# Patient Record
Sex: Male | Born: 1950 | Race: Black or African American | Hispanic: No | Marital: Single | Smoking: Current every day smoker
Health system: Southern US, Community
[De-identification: ages and names within clinical notes are randomized; demographics above are authoritative.]

## PROBLEM LIST (undated history)

## (undated) DIAGNOSIS — C801 Malignant (primary) neoplasm, unspecified: Secondary | ICD-10-CM

## (undated) DIAGNOSIS — I10 Essential (primary) hypertension: Secondary | ICD-10-CM

## (undated) DIAGNOSIS — IMO0001 Reserved for inherently not codable concepts without codable children: Secondary | ICD-10-CM

## (undated) DIAGNOSIS — R569 Unspecified convulsions: Secondary | ICD-10-CM

## (undated) DIAGNOSIS — C139 Malignant neoplasm of hypopharynx, unspecified: Secondary | ICD-10-CM

## (undated) DIAGNOSIS — F1010 Alcohol abuse, uncomplicated: Secondary | ICD-10-CM

## (undated) DIAGNOSIS — J449 Chronic obstructive pulmonary disease, unspecified: Secondary | ICD-10-CM

## (undated) DIAGNOSIS — K219 Gastro-esophageal reflux disease without esophagitis: Secondary | ICD-10-CM

## (undated) DIAGNOSIS — J479 Bronchiectasis, uncomplicated: Secondary | ICD-10-CM

## (undated) HISTORY — DX: Essential (primary) hypertension: I10

## (undated) HISTORY — PX: OTHER SURGICAL HISTORY: SHX169

## (undated) HISTORY — PX: TRACHEOSTOMY: SUR1362

## (undated) HISTORY — DX: Unspecified convulsions: R56.9

---

## 2014-06-17 ENCOUNTER — Emergency Department (HOSPITAL_COMMUNITY)
Admission: EM | Admit: 2014-06-17 | Discharge: 2014-06-17 | Disposition: A | Discharge status: Home / Self Care | Payer: Medicaid Other | Attending: Emergency Medicine | Admitting: Emergency Medicine

## 2014-06-17 ENCOUNTER — Encounter (HOSPITAL_COMMUNITY): Payer: Self-pay | Admitting: Emergency Medicine

## 2014-06-17 DIAGNOSIS — R61 Generalized hyperhidrosis: Secondary | ICD-10-CM

## 2014-06-17 DIAGNOSIS — F17200 Nicotine dependence, unspecified, uncomplicated: Secondary | ICD-10-CM | POA: Insufficient documentation

## 2014-06-17 LAB — CBC WITH DIFFERENTIAL/PLATELET
Basophils Absolute: 0 10*3/uL (ref 0.0–0.1)
Basophils Relative: 0 % (ref 0–1)
Eosinophils Absolute: 0 10*3/uL (ref 0.0–0.7)
Eosinophils Relative: 0 % (ref 0–5)
HEMATOCRIT: 35.2 % — AB (ref 39.0–52.0)
HEMOGLOBIN: 12 g/dL — AB (ref 13.0–17.0)
LYMPHS PCT: 9 % — AB (ref 12–46)
Lymphs Abs: 0.7 10*3/uL (ref 0.7–4.0)
MCH: 30.2 pg (ref 26.0–34.0)
MCHC: 34.1 g/dL (ref 30.0–36.0)
MCV: 88.7 fL (ref 78.0–100.0)
MONO ABS: 0.7 10*3/uL (ref 0.1–1.0)
MONOS PCT: 8 % (ref 3–12)
Neutro Abs: 6.7 10*3/uL (ref 1.7–7.7)
Neutrophils Relative %: 83 % — ABNORMAL HIGH (ref 43–77)
Platelets: 263 10*3/uL (ref 150–400)
RBC: 3.97 MIL/uL — AB (ref 4.22–5.81)
RDW: 15.2 % (ref 11.5–15.5)
WBC: 8.1 10*3/uL (ref 4.0–10.5)

## 2014-06-17 LAB — BASIC METABOLIC PANEL
BUN: 13 mg/dL (ref 6–23)
CHLORIDE: 99 meq/L (ref 96–112)
CO2: 25 mEq/L (ref 19–32)
Calcium: 9.7 mg/dL (ref 8.4–10.5)
Creatinine, Ser: 0.9 mg/dL (ref 0.50–1.35)
GFR calc non Af Amer: >90 mL/min (ref >90)
Glucose, Bld: 137 mg/dL — ABNORMAL HIGH (ref 70–99)
Potassium: 4.9 mEq/L (ref 3.7–5.3)
SODIUM: 137 meq/L (ref 137–147)

## 2014-06-17 LAB — TROPONIN I: Troponin I: <0.3 ng/mL (ref <0.30)

## 2014-06-17 MED ORDER — ASPIRIN 81 MG PO CHEW
324.0000 mg | CHEWABLE_TABLET | Freq: Once | ORAL | Status: AC
  Administered 2014-06-17: 324 mg via ORAL

## 2014-06-17 MED ORDER — ASPIRIN 81 MG PO CHEW
CHEWABLE_TABLET | ORAL | Status: AC
  Filled 2014-06-17: qty 4

## 2014-06-17 NOTE — ED Provider Notes (Signed)
CSN: [id number]     Arrival date & time 06/17/14  1012 History  This chart was scribed for Randy Christen, MD by Marlowe Kays, ED Scribe. This patient was seen in room APA07/APA07 and the patient's care was started at 10:35 AM.  Chief Complaint  Patient presents with  . Fatigue   The history is provided by the patient and the EMS personnel. No language interpreter was used.   HPI Comments:  Randy Blackwell is a 63 y.o. male brought in by EMS, who presents to the Emergency Department complaining of sudden onset diaphoresis while he was walking earlier this morning. He reports associated shakiness.  Pt states his friend was concerned about him and called EMS, although he states he now feels back to normal. He denies SOB, CP, vomiting, or diarrhea. Pt denies any chronic illnesses. He states he does not have a PCP.   History reviewed. No pertinent past medical history. History reviewed. No pertinent past surgical history. History reviewed. No pertinent family history. History  Substance Use Topics  . Smoking status: Current Every Day Smoker -- 0.50 packs/day    Types: Cigarettes  . Smokeless tobacco: Not on file  . Alcohol Use: Yes     Comment: 1-2 beers a day    Review of Systems A complete 10 system review of systems was obtained and all systems are negative except as noted in the HPI and PMH.   Allergies  Review of patient's allergies indicates no known allergies.  Home Medications   Prior to Admission medications   Not on File   Triage Vitals: BP 119/88  Pulse 100  Temp(Src) 98.5 F (36.9 C) (Oral)  Resp 20  Ht 5\' 10"  (1.778 m)  Wt 130 lb (58.968 kg)  BMI 18.65 kg/m2  SpO2 100% Physical Exam  Nursing note and vitals reviewed. Constitutional: He is oriented to person, place, and time. He appears well-developed and well-nourished. No distress.  HENT:  Head: Normocephalic and atraumatic.  Eyes: Conjunctivae and EOM are normal. Pupils are equal, round, and reactive to  light.  Neck: Normal range of motion. Neck supple.  Cardiovascular: Normal rate, regular rhythm and normal heart sounds.   Pulmonary/Chest: Effort normal and breath sounds normal.  Abdominal: Soft. Bowel sounds are normal.  Musculoskeletal: Normal range of motion.  Neurological: He is alert and oriented to person, place, and time.  Skin: Skin is warm and dry. He is not diaphoretic.  Psychiatric: He has a normal mood and affect. His behavior is normal.    ED Course  Procedures (including critical care time) DIAGNOSTIC STUDIES: Oxygen Saturation is 100% on RA, normal by my interpretation.   COORDINATION OF CARE: 10:37 AM- Will order lab work. Pt verbalizes understanding and agrees to plan.  Medications  aspirin chewable tablet 324 mg (324 mg Oral Given 06/17/14 1137)    Labs Review Labs Reviewed  BASIC METABOLIC PANEL - Abnormal; Notable for the following:    Glucose, Bld 137 (*)    All other components within normal limits  CBC WITH DIFFERENTIAL - Abnormal; Notable for the following:    RBC 3.97 (*)    Hemoglobin 12.0 (*)    HCT 35.2 (*)    Neutrophils Relative % 83 (*)    Lymphocytes Relative 9 (*)    All other components within normal limits  TROPONIN I    Imaging Review No results found.   EKG Interpretation   Date/Time:  Sunday June 17 2014 10:47:20 EDT Ventricular Rate:  67 PR  Interval:  155 QRS Duration: 79 QT Interval:  411 QTC Calculation: 434 R Axis:   62 Text Interpretation:  Sinus rhythm Left ventricular hypertrophy Inferior  infarct, acute (LCx) Anterior infarct, old Lateral leads are also involved  Confirmed by COOK  MD, BRIAN ([id number]) on 06/17/2014 12:16:15 PM      MDM   Final diagnoses:  Diaphoresis    Patient is totally back to his baseline. No chest pain, dyspnea, diaphoresis, nausea. Glucose minimally elevated. EKG, troponin negative. Patient encouraged to increase fluids and stay out of the sun.  I personally performed the services  described in this documentation, which was scribed in my presence. The recorded information has been reviewed and is accurate.    Randy Christen, MD 06/17/14 1249

## 2014-06-17 NOTE — Discharge Instructions (Signed)
Increase fluids. Rest. Return if worse, especially chest pain or shortness of breath

## 2014-06-17 NOTE — ED Notes (Addendum)
Pt brought in EMS, per EMS pt was walking this am like he usually does in the morning and became diaphoretic. Pt denies any cp,sob,dizziness. Pt family wanted pt evaluated. nad noted.CBG en route 227. Pt non-diaphoretic at this time.

## 2016-04-06 ENCOUNTER — Encounter: Payer: Self-pay | Admitting: Internal Medicine

## 2016-04-07 ENCOUNTER — Other Ambulatory Visit (HOSPITAL_COMMUNITY): Payer: Self-pay | Admitting: Internal Medicine

## 2016-04-07 DIAGNOSIS — R634 Abnormal weight loss: Secondary | ICD-10-CM

## 2016-04-10 ENCOUNTER — Ambulatory Visit (HOSPITAL_COMMUNITY)
Admission: RE | Admit: 2016-04-10 | Discharge: 2016-04-10 | Disposition: A | Discharge status: Home / Self Care | Payer: Medicaid Other | Source: Ambulatory Visit | Attending: Internal Medicine | Admitting: Internal Medicine

## 2016-04-10 DIAGNOSIS — R634 Abnormal weight loss: Secondary | ICD-10-CM | POA: Insufficient documentation

## 2016-04-10 DIAGNOSIS — J479 Bronchiectasis, uncomplicated: Secondary | ICD-10-CM | POA: Insufficient documentation

## 2016-04-10 DIAGNOSIS — J439 Emphysema, unspecified: Secondary | ICD-10-CM | POA: Diagnosis not present

## 2016-04-10 DIAGNOSIS — I2510 Atherosclerotic heart disease of native coronary artery without angina pectoris: Secondary | ICD-10-CM | POA: Insufficient documentation

## 2016-04-10 LAB — POCT I-STAT CREATININE: CREATININE: 0.8 mg/dL (ref 0.61–1.24)

## 2016-04-10 MED ORDER — IOPAMIDOL (ISOVUE-300) INJECTION 61%
75.0000 mL | Freq: Once | INTRAVENOUS | Status: AC | PRN
  Administered 2016-04-10: 75 mL via INTRAVENOUS

## 2016-04-23 ENCOUNTER — Emergency Department (HOSPITAL_COMMUNITY): Payer: Medicaid Other

## 2016-04-23 ENCOUNTER — Emergency Department (HOSPITAL_COMMUNITY)
Admission: EM | Admit: 2016-04-23 | Discharge: 2016-04-23 | Disposition: A | Discharge status: Home / Self Care | Payer: Medicaid Other | Attending: Emergency Medicine | Admitting: Emergency Medicine

## 2016-04-23 ENCOUNTER — Encounter (HOSPITAL_COMMUNITY): Payer: Self-pay | Admitting: Emergency Medicine

## 2016-04-23 DIAGNOSIS — F17210 Nicotine dependence, cigarettes, uncomplicated: Secondary | ICD-10-CM | POA: Insufficient documentation

## 2016-04-23 DIAGNOSIS — Z09 Encounter for follow-up examination after completed treatment for conditions other than malignant neoplasm: Secondary | ICD-10-CM | POA: Diagnosis present

## 2016-04-23 DIAGNOSIS — J471 Bronchiectasis with (acute) exacerbation: Secondary | ICD-10-CM | POA: Diagnosis not present

## 2016-04-23 LAB — URINALYSIS, ROUTINE W REFLEX MICROSCOPIC
GLUCOSE, UA: NEGATIVE mg/dL
Hgb urine dipstick: NEGATIVE
Nitrite: NEGATIVE
PH: 6 (ref 5.0–8.0)
Protein, ur: 30 mg/dL — AB
SPECIFIC GRAVITY, URINE: 1.025 (ref 1.005–1.030)

## 2016-04-23 LAB — CBC WITH DIFFERENTIAL/PLATELET
BASOS ABS: 0 10*3/uL (ref 0.0–0.1)
BASOS PCT: 0 %
EOS PCT: 1 %
Eosinophils Absolute: 0.1 10*3/uL (ref 0.0–0.7)
HEMATOCRIT: 39.5 % (ref 39.0–52.0)
Hemoglobin: 13.5 g/dL (ref 13.0–17.0)
Lymphocytes Relative: 16 %
Lymphs Abs: 0.9 10*3/uL (ref 0.7–4.0)
MCH: 32.6 pg (ref 26.0–34.0)
MCHC: 34.2 g/dL (ref 30.0–36.0)
MCV: 95.4 fL (ref 78.0–100.0)
Monocytes Absolute: 0.8 10*3/uL (ref 0.1–1.0)
Monocytes Relative: 14 %
NEUTROS ABS: 3.7 10*3/uL (ref 1.7–7.7)
Neutrophils Relative %: 69 %
PLATELETS: 315 10*3/uL (ref 150–400)
RBC: 4.14 MIL/uL — ABNORMAL LOW (ref 4.22–5.81)
RDW: 14.3 % (ref 11.5–15.5)
WBC: 5.5 10*3/uL (ref 4.0–10.5)

## 2016-04-23 LAB — TSH: TSH: 0.266 u[IU]/mL — ABNORMAL LOW (ref 0.350–4.500)

## 2016-04-23 LAB — COMPREHENSIVE METABOLIC PANEL
ALBUMIN: 3 g/dL — AB (ref 3.5–5.0)
ALT: 7 U/L — ABNORMAL LOW (ref 17–63)
AST: 14 U/L — AB (ref 15–41)
Alkaline Phosphatase: 58 U/L (ref 38–126)
Anion gap: 8 (ref 5–15)
BUN: 6 mg/dL (ref 6–20)
CO2: 30 mmol/L (ref 22–32)
Calcium: 9.3 mg/dL (ref 8.9–10.3)
Chloride: 103 mmol/L (ref 101–111)
Creatinine, Ser: 0.7 mg/dL (ref 0.61–1.24)
GFR calc Af Amer: >60 mL/min (ref >60)
GFR calc non Af Amer: >60 mL/min (ref >60)
GLUCOSE: 92 mg/dL (ref 65–99)
POTASSIUM: 3.8 mmol/L (ref 3.5–5.1)
Sodium: 141 mmol/L (ref 135–145)
Total Bilirubin: 0.4 mg/dL (ref 0.3–1.2)
Total Protein: 6.6 g/dL (ref 6.5–8.1)

## 2016-04-23 LAB — URINE MICROSCOPIC-ADD ON

## 2016-04-23 MED ORDER — SODIUM CHLORIDE 0.9 % IV BOLUS (SEPSIS)
1000.0000 mL | Freq: Once | INTRAVENOUS | Status: AC
  Administered 2016-04-23: 1000 mL via INTRAVENOUS

## 2016-04-23 MED ORDER — PANTOPRAZOLE SODIUM 40 MG IV SOLR
20.0000 mg | Freq: Once | INTRAVENOUS | Status: AC
Start: 2016-04-23 — End: 2016-04-23
  Administered 2016-04-23: 20 mg via INTRAVENOUS
  Filled 2016-04-23: qty 40

## 2016-04-23 MED ORDER — LEVOFLOXACIN 500 MG PO TABS: 500.0000 mg | ORAL_TABLET | Freq: Every day | ORAL | Status: DC

## 2016-04-23 MED ORDER — PREDNISONE 50 MG PO TABS
ORAL_TABLET | ORAL | Status: DC
Start: 2016-04-23 — End: 2016-05-20

## 2016-04-23 NOTE — ED Notes (Signed)
RN called to room. Pt coughed up small amount of bight red blood. EDP notified.

## 2016-04-23 NOTE — ED Notes (Signed)
RN spoke with pt friend Langley Adie [phone]. States that she received a call this am from [facility] stating pt should be seen at [facility] ED for dehydration. Ms. Carmie End also repots pt has had poor appetite for several months and has been vomitting blood, reports pt seen at [facility] ED last night for same.

## 2016-04-23 NOTE — ED Notes (Signed)
Medical records requested from [facility].

## 2016-04-23 NOTE — ED Provider Notes (Signed)
CSN: [id number]     Arrival date & time 04/23/16  1007 History  By signing my name below, I, Stephania Fragmin, attest that this documentation has been prepared under the direction and in the presence of [name], MD. Electronically Signed: Stephania Fragmin, ED Scribe. 04/23/2016. 11:22 AM.    Chief Complaint  Patient presents with  . Follow-up   The history is provided by the patient. No language interpreter was used.   HPI Comments: Randy Blackwell is a 65 y.o. male who presents to the Emergency Department for a follow-up after being seen at [facility] ED last night at 3 AM, about 8 hours ago, for hemoptysis, with onset 2 weeks ago. Patient also notes a sore throat and reports he lost 20 pounds over the past 6 months. Patient had an XR and blood tests, but states, "they found nothing." He was sent home but this morning was called by his power of attorney, Ms. Monna Fam [phone]), and was instructed to come to the ED to check his "nose and throat." Patient himself is unsure why is here. He states he feels the same as he did 2 hours ago. He reports a history of smoking. He denies fever, chills, or chest pain.   History reviewed. No pertinent past medical history. History reviewed. No pertinent past surgical history. History reviewed. No pertinent family history. Social History  Substance Use Topics  . Smoking status: Current Every Day Smoker -- 0.50 packs/day    Types: Cigarettes  . Smokeless tobacco: None  . Alcohol Use: Yes     Comment: "a lot"    Review of Systems  Constitutional: Positive for unexpected weight change. Negative for fever and chills.  HENT: Positive for sore throat.   Cardiovascular: Negative for chest pain.  Gastrointestinal: Positive for vomiting.  All other systems reviewed and are negative.   Allergies  Review of patient's allergies indicates no known allergies.  Home Medications   Prior to Admission medications   Medication Sig Start Date End Date Taking?  Authorizing Provider  levofloxacin (LEVAQUIN) 500 MG tablet Take 1 tablet (500 mg total) by mouth daily. 04/23/16   [name], MD  predniSONE (DELTASONE) 50 MG tablet One tab or 6 days, one half tablet for 6 days 04/23/16   [name], MD   BP 158/86 mmHg  Pulse 65  Temp(Src) 97.7 F (36.5 C) (Oral)  Resp 18  Ht 5\' 10"  (1.778 m)  Wt 130 lb (58.968 kg)  BMI 18.65 kg/m2  SpO2 99% Physical Exam  Constitutional: He is oriented to person, place, and time. He appears well-developed and well-nourished.  Thin, pleasant, no acute distress.  HENT:  Head: Normocephalic and atraumatic.  Throat appears normal.  Eyes: Conjunctivae and EOM are normal. Pupils are equal, round, and reactive to light.  Neck: Normal range of motion. Neck supple.  Cardiovascular: Normal rate and regular rhythm.   Pulmonary/Chest: Effort normal and breath sounds normal.  Abdominal: Soft. Bowel sounds are normal.  Musculoskeletal: Normal range of motion.  Neurological: He is alert and oriented to person, place, and time.  Skin: Skin is warm and dry.  Psychiatric: He has a normal mood and affect. His behavior is normal.  Nursing note and vitals reviewed.   ED Course  Procedures (including critical care time)  DIAGNOSTIC STUDIES: Oxygen Saturation is 100% on RA, normal by my interpretation.    COORDINATION OF CARE: 11:09 AM - Will discuss pt with PoA.  Labs Review Labs Reviewed  CBC WITH DIFFERENTIAL/PLATELET -  Abnormal; Notable for the following:    RBC 4.14 (*)    All other components within normal limits  COMPREHENSIVE METABOLIC PANEL - Abnormal; Notable for the following:    Albumin 3.0 (*)    AST 14 (*)    ALT 7 (*)    All other components within normal limits  URINALYSIS, ROUTINE W REFLEX MICROSCOPIC (NOT AT [facility]) - Abnormal; Notable for the following:    APPearance HAZY (*)    Bilirubin Urine SMALL (*)    Ketones, ur TRACE (*)    Protein, ur 30 (*)    Leukocytes, UA MODERATE (*)    All other  components within normal limits  TSH - Abnormal; Notable for the following:    TSH 0.266 (*)    All other components within normal limits  URINE MICROSCOPIC-ADD ON - Abnormal; Notable for the following:    Squamous Epithelial / LPF 6-30 (*)    Bacteria, UA FEW (*)    All other components within normal limits    Imaging Review Dg Chest 2 View  04/23/2016  CLINICAL DATA:  Pt's brother states they were seen at [facility] ER last night and had tests ran for several months of coughing up blood and throat pain. Said someone called this morning and said to go to the emergency room but does not know why. EXAM: CHEST  2 VIEW COMPARISON:  Chest CT, 04/10/2016 FINDINGS: Normal heart, mediastinum and hila. Lungs show mild increased markings a right base reflecting the cystic bronchiectasis noted on prior CT. No evidence of pneumonia or pulmonary edema. No pleural effusion or pneumothorax. Skeletal structures are unremarkable. IMPRESSION: No acute cardiopulmonary disease. Electronically Signed   By: Lajean Manes M.D.   On: 04/23/2016 12:21   I have personally reviewed and evaluated these images and lab results as part of my medical decision-making.   EKG Interpretation None      MDM   Final diagnoses:  Bronchiectasis with acute exacerbation ([place])   Patient is in no acute distress, respiratory or otherwise. CT chest from 04/10/2016 reviewed which showed right lower lobe bronchiectasis. Will Rx Levaquin and prednisone. Will get primary care follow-up.   I personally performed the services described in this documentation, which was scribed in my presence. The recorded information has been reviewed and is accurate.      [name], MD 04/23/16 [phone]

## 2016-04-23 NOTE — Discharge Instructions (Signed)
Bronchiectasis Bronchiectasis is a condition in which the airways (bronchi) are damaged and widened. This makes it difficult for the lungs to get rid of mucus. As a result, mucus gathers in the airways, and this often leads to lung infections. Infection can cause inflammation in the airways, which may further weaken and damage the bronchi.  CAUSES  Bronchiectasis may be present at birth (congenital) or may develop later in life. Sometimes there is no apparent cause. Some common causes include:  Cystic fibrosis.   Recurrent lung infections (such as pneumonia, tuberculosis, or fungal infections).  Foreign bodies or other blockages in the lungs.  Breathing in fluid, food, or other foreign objects (aspiration). SIGNS AND SYMPTOMS  Common symptoms include:  A daily cough that brings up mucus and lasts for more than 3 weeks.  Frequent lung infections (such as pneumonia, tuberculosis, or fungal infections).  Shortness of breath and wheezing.   Weakness and fatigue. DIAGNOSIS  Various tests may be done to help diagnose bronchiectasis. Tests may include:  Chest X-rays or CT scans.   Breathing tests to help determine how your lungs are working.   Sputum cultures to check for infection.   Blood tests and other tests to check for related diseases or causes, such as cystic fibrosis. TREATMENT  Treatment varies depending on the severity of the condition. Medicines may be given to loosen the mucus to be coughed up (expectorants), to relax the muscles of the air passages (bronchodilators), or to prevent or treat infections (antibiotics). Physical therapy methods may be recommended to help clear mucus from the lungs. For severe cases, surgery may be done to remove the affected part of the lung. HOME CARE INSTRUCTIONS   Get plenty of rest.   Only take over-the-counter or prescription medicines as directed by your health care provider. If antibiotic medicines were prescribed, take them as  directed. Finish them even if you start to feel better.  Avoid sedatives and antihistamines unless otherwise directed by your health care provider. These medicines tend to thicken the mucus in the lungs.   Perform any breathing exercises or techniques to clear the lungs as directed by your health care provider.  Drink enough fluids to keep your urine clear or pale yellow.  Consider using a cold steam vaporizer or humidifier in your room or home to help loosen secretions.   If the cough is worse at night, try sleeping in a semi-upright position in a recliner or using a couple of pillows.   Avoid cigarette smoke and lung irritants. If you smoke, quit.  Stay inside when pollution and ozone levels are high.   Stay current with vaccinations and immunizations.   Follow up with your health care provider as directed.  SEEK MEDICAL CARE IF:  You cough up more thick, discolored mucus (sputum) that is yellow to green in color.  You have a fever or persistent symptoms for more than 2-3 days.  You cannot control your cough and are losing sleep. SEEK IMMEDIATE MEDICAL CARE IF:   You cough up blood.   You have chest pain or increasing shortness of breath.   You have pain that is getting worse or is uncontrolled with medicines.   You have a fever and your symptoms suddenly get worse. MAKE SURE YOU:  Understand these instructions.   Will watch your condition.   Will get help right away if you are not doing well or get worse.    This information is not intended to replace advice given to   you by your health care provider. Make sure you discuss any questions you have with your health care provider.   Document Released: 10/11/2007 Document Revised: 12/19/2013 Document Reviewed: 06/21/2013 Elsevier Interactive Patient Education 2016 [employer].   Prescription for antibiotic and prednisone. Stop smoking. Follow-up your primary care doctor.

## 2016-04-23 NOTE — ED Notes (Signed)
Per staff at [facility] friend Langley Adie called to [facility] with report of pt sx and being seen at [facility] ED last night. Staff instructed that pt may go to [facility] or [facility] if pt of friend felt he needed further evaluation.

## 2016-04-23 NOTE — ED Notes (Signed)
Pt's brother states they were seen at [facility] ER last night and had tests ran for several months of coughing up blood and throat pain.  Said someone called this morning and said to go to the emergency room but does not know why.

## 2016-04-30 ENCOUNTER — Encounter: Payer: Self-pay | Admitting: Gastroenterology

## 2016-04-30 ENCOUNTER — Telehealth: Payer: Self-pay | Admitting: Gastroenterology

## 2016-04-30 ENCOUNTER — Other Ambulatory Visit: Payer: Self-pay

## 2016-04-30 ENCOUNTER — Ambulatory Visit (INDEPENDENT_AMBULATORY_CARE_PROVIDER_SITE_OTHER): Payer: Medicaid Other | Admitting: Gastroenterology

## 2016-04-30 VITALS — BP 173/94 | HR 84 | Temp 98.9°F | Ht 71.0 in | Wt 111.4 lb

## 2016-04-30 DIAGNOSIS — R634 Abnormal weight loss: Secondary | ICD-10-CM

## 2016-04-30 DIAGNOSIS — R1310 Dysphagia, unspecified: Secondary | ICD-10-CM | POA: Insufficient documentation

## 2016-04-30 MED ORDER — NA SULFATE-K SULFATE-MG SULF 17.5-3.13-1.6 GM/177ML PO SOLN: 1.0000 | Freq: Once | ORAL | Status: DC

## 2016-04-30 NOTE — Patient Instructions (Signed)
Randy Blackwell  04/30/2016     @PREFPERIOPPHARMACY @   Your procedure is scheduled on 05/05/2016.  Report to [facility] at 6:15 A.M.  Call this number if you have problems the morning of surgery:  [phone]   Remember:  Do not eat food or drink liquids after midnight.  Take these medicines the morning of surgery with A SIP OF WATER NA   Do not wear jewelry, make-up or nail polish.  Do not wear lotions, powders, or perfumes.  You may wear deodorant.  Do not shave 48 hours prior to surgery.  Men may shave face and neck.  Do not bring valuables to the hospital.  [facility] is not responsible for any belongings or valuables.  Contacts, dentures or bridgework may not be worn into surgery.  Leave your suitcase in the car.  After surgery it may be brought to your room.  For patients admitted to the hospital, discharge time will be determined by your treatment team.  Patients discharged the day of surgery will not be allowed to drive home.    Please read over the following fact sheets that you were given. Anesthesia Post-op Instructions     PATIENT INSTRUCTIONS POST-ANESTHESIA  IMMEDIATELY FOLLOWING SURGERY:  Do not drive or operate machinery for the first twenty four hours after surgery.  Do not make any important decisions for twenty four hours after surgery or while taking narcotic pain medications or sedatives.  If you develop intractable nausea and vomiting or a severe headache please notify your doctor immediately.  FOLLOW-UP:  Please make an appointment with your surgeon as instructed. You do not need to follow up with anesthesia unless specifically instructed to do so.  WOUND CARE INSTRUCTIONS (if applicable):  Keep a dry clean dressing on the anesthesia/puncture wound site if there is drainage.  Once the wound has quit draining you may leave it open to air.  Generally you should leave the bandage intact for twenty four hours unless there is drainage.  If the epidural site  drains for more than 36-48 hours please call the anesthesia department.  QUESTIONS?:  Please feel free to call your physician or the hospital operator if you have any questions, and they will be happy to assist you.      Colonoscopy A colonoscopy is an exam to look at the entire large intestine (colon). This exam can help find problems such as tumors, polyps, inflammation, and areas of bleeding. The exam takes about 1 hour.  LET [facility] CARE PROVIDER KNOW ABOUT:   Any allergies you have.  All medicines you are taking, including vitamins, herbs, eye drops, creams, and over-the-counter medicines.  Previous problems you or members of your family have had with the use of anesthetics.  Any blood disorders you have.  Previous surgeries you have had.  Medical conditions you have. RISKS AND COMPLICATIONS  Generally, this is a safe procedure. However, as with any procedure, complications can occur. Possible complications include:  Bleeding.  Tearing or rupture of the colon wall.  Reaction to medicines given during the exam.  Infection (rare). BEFORE THE PROCEDURE   Ask your health care provider about changing or stopping your regular medicines.  You may be prescribed an oral bowel prep. This involves drinking a large amount of medicated liquid, starting the day before your procedure. The liquid will cause you to have multiple loose stools until your stool is almost clear or light green. This cleans out your colon in preparation for the  procedure.  Do not eat or drink anything else once you have started the bowel prep, unless your health care provider tells you it is safe to do so.  Arrange for someone to drive you home after the procedure. PROCEDURE   You will be given medicine to help you relax (sedative).  You will lie on your side with your knees bent.  A long, flexible tube with a light and camera on the end (colonoscope) will be inserted through the rectum and into the  colon. The camera sends video back to a computer screen as it moves through the colon. The colonoscope also releases carbon dioxide gas to inflate the colon. This helps your health care provider see the area better.  During the exam, your health care provider may take a small tissue sample (biopsy) to be examined under a microscope if any abnormalities are found.  The exam is finished when the entire colon has been viewed. AFTER THE PROCEDURE   Do not drive for 24 hours after the exam.  You may have a small amount of blood in your stool.  You may pass moderate amounts of gas and have mild abdominal cramping or bloating. This is caused by the gas used to inflate your colon during the exam.  Ask when your test results will be ready and how you will get your results. Make sure you get your test results.   This information is not intended to replace advice given to you by your health care provider. Make sure you discuss any questions you have with your health care provider.   Document Released: 12/11/2000 Document Revised: 10/04/2013 Document Reviewed: 08/21/2013 Elsevier Interactive Patient Education 2016 [place]. Esophagogastroduodenoscopy Esophagogastroduodenoscopy (EGD) is a procedure that is used to examine the lining of the esophagus, stomach, and first part of the small intestine (duodenum). A long, flexible, lighted tube with a camera attached (endoscope) is inserted down the throat to view these organs. This procedure is done to detect problems or abnormalities, such as inflammation, bleeding, ulcers, or growths, in order to treat them. The procedure lasts 5-20 minutes. It is usually an outpatient procedure, but it may need to be performed in a hospital in emergency cases. LET [facility] CARE PROVIDER KNOW ABOUT:  Any allergies you have.  All medicines you are taking, including vitamins, herbs, eye drops, creams, and over-the-counter medicines.  Previous problems you or members  of your family have had with the use of anesthetics.  Any blood disorders you have.  Previous surgeries you have had.  Medical conditions you have. RISKS AND COMPLICATIONS Generally, this is a safe procedure. However, problems can occur and include:  Infection.  Bleeding.  Tearing (perforation) of the esophagus, stomach, or duodenum.  Difficulty breathing or not being able to breathe.  Excessive sweating.  Spasms of the larynx.  Slowed heartbeat.  Low blood pressure. BEFORE THE PROCEDURE  Do not eat or drink anything after midnight on the night before the procedure or as directed by your health care provider.  Do not take your regular medicines before the procedure if your health care provider asks you not to. Ask your health care provider about changing or stopping those medicines.  If you wear dentures, be prepared to remove them before the procedure.  Arrange for someone to drive you home after the procedure. PROCEDURE  A numbing medicine (local anesthetic) may be sprayed in your throat for comfort and to stop you from gagging or coughing.  You will have an  IV tube inserted in a vein in your hand or arm. You will receive medicines and fluids through this tube.  You will be given a medicine to relax you (sedative).  A pain reliever will be given through the IV tube.  A mouth guard may be placed in your mouth to protect your teeth and to keep you from biting on the endoscope.  You will be asked to lie on your left side.  The endoscope will be inserted down your throat and into your esophagus, stomach, and duodenum.  Air will be put through the endoscope to allow your health care provider to clearly view the lining of your esophagus.  The lining of your esophagus, stomach, and duodenum will be examined. During the exam, your health care provider may:  Remove tissue to be examined under a microscope (biopsy) for inflammation, infection, or other medical  problems.  Remove growths.  Remove objects (foreign bodies) that are stuck.  Treat any bleeding with medicines or other devices that stop tissues from bleeding (hot cautery, clipping devices).  Widen (dilate) or stretch narrowed areas of your esophagus and stomach.  The endoscope will be withdrawn. AFTER THE PROCEDURE  You will be taken to a recovery area for observation. Your blood pressure, heart rate, breathing rate, and blood oxygen level will be monitored often until the medicines you were given have worn off.  Do not eat or drink anything until the numbing medicine has worn off and your gag reflex has returned. You may choke.  Your health care provider should be able to discuss his or her findings with you. It will take longer to discuss the test results if any biopsies were taken.   This information is not intended to replace advice given to you by your health care provider. Make sure you discuss any questions you have with your health care provider.   Document Released: 04/16/2005 Document Revised: 01/04/2015 Document Reviewed: 11/16/2012 Elsevier Interactive Patient Education 2016 [employer] Esophageal Dilatation Esophageal dilatation is a procedure to open a blocked or narrowed part of the esophagus. The esophagus is the long tube in your throat that carries food and liquid from your mouth to your stomach. The procedure is also called esophageal dilation.  You may need this procedure if you have a buildup of scar tissue in your esophagus that makes it difficult, painful, or even impossible to swallow. This can be caused by gastroesophageal reflux disease (GERD). In rare cases, people need this procedure because they have cancer of the esophagus or a problem with the way food moves through the esophagus. Sometimes you may need to have another dilatation to enlarge the opening of the esophagus gradually. LET [facility] CARE PROVIDER KNOW ABOUT:   Any allergies you  have.  All medicines you are taking, including vitamins, herbs, eye drops, creams, and over-the-counter medicines.  Previous problems you or members of your family have had with the use of anesthetics.  Any blood disorders you have.  Previous surgeries you have had.  Medical conditions you have.  Any antibiotic medicines you are required to take before dental procedures. RISKS AND COMPLICATIONS Generally, this is a safe procedure. However, problems can occur and include:  Bleeding from a tear in the lining of the esophagus.  A hole (perforation) in the esophagus. BEFORE THE PROCEDURE  Do not eat or drink anything after midnight on the night before the procedure or as directed by your health care provider.  Ask your health care provider about  changing or stopping your regular medicines. This is especially important if you are taking diabetes medicines or blood thinners.  Plan to have someone take you home after the procedure. PROCEDURE   You will be given a medicine that makes you relaxed and sleepy (sedative).  A medicine may be sprayed or gargled to numb the back of the throat.  Your health care provider can use various instruments to do an esophageal dilatation. During the procedure, the instrument used will be placed in your mouth and passed down into your esophagus. Options include:  Simple dilators. This instrument is carefully placed in the esophagus to stretch it.  Guided wire bougies. In this method, a flexible tube (endoscope) is used to insert a wire into the esophagus. The dilator is passed over this wire to enlarge the esophagus. Then the wire is removed.  Balloon dilators. An endoscope with a small balloon at the end is passed down into the esophagus. Inflating the balloon gently stretches the esophagus and opens it up. AFTER THE PROCEDURE  Your blood pressure, heart rate, breathing rate, and blood oxygen level will be monitored often until the medicines you were  given have worn off.  Your throat may feel slightly sore and will probably still feel numb. This will improve slowly over time.  You will not be allowed to eat or drink until the throat numbness has resolved.  If this is a same-day procedure, you may be allowed to go home once you have been able to drink, urinate, and sit on the edge of the bed without nausea or dizziness.  If this is a same-day procedure, you should have a friend or family member with you for the next 24 hours after the procedure.   This information is not intended to replace advice given to you by your health care provider. Make sure you discuss any questions you have with your health care provider.   Document Released: 02/04/2006 Document Revised: 01/04/2015 Document Reviewed: 04/25/2014 Elsevier Interactive Patient Education [employer].

## 2016-04-30 NOTE — Patient Instructions (Signed)
We have scheduled you for a colonoscopy and upper endoscopy with dilation with Dr. Oneida Alar as soon as possible.  Further recommendations to follow.

## 2016-04-30 NOTE — Progress Notes (Signed)
Primary Care Physician:  [name], MD Primary Gastroenterologist:  Dr. Oneida Alar   Chief Complaint  Patient presents with  . Dysphagia    HPI:   Randy Blackwell is a 65 y.o. male presenting today at the request of his PCP secondary to weight loss and dysphagia.   Notes progressive dysphagia over the past few weeks but present "for a long time", points to neck and upper chest. No odynophagia. Associated nausea. No vomiting. Occasional reflux symptoms. No prior endoscopy. No prior colonoscopy. Normal baseline around 140-150. Now 111. Weight loss unintentional over the past year. No abdominal pain. Has been eating broth. Hemoptysis noted. No hematemesis. No rectal bleeding. No FH of colon cancer, stomach cancers. Occasional alka seltzer. CT chest with contrast April 10, 2016, noted extensive bronchiestasis in the right lower lobe basal segments, presumably from necrotizing pneumonia. Most recent CXR from 4/27.  Prescribed Levaquin. Esophagus unremarkable in appearance.    Past Medical History  Diagnosis Date  . Seizures ([place])     per PCP notes  . Hypertension     per PCP notes  . Diabetes ([place])     per PCP notes     Past Surgical History  Procedure Laterality Date  . None      Current Outpatient Prescriptions  Medication Sig Dispense Refill  . aspirin-sod bicarb-citric acid (ALKA-SELTZER) 325 MG TBEF tablet Take 325 mg by mouth every 6 (six) hours as needed.    [name] levofloxacin (LEVAQUIN) 500 MG tablet Take 1 tablet (500 mg total) by mouth daily. 10 tablet 0  . predniSONE (DELTASONE) 50 MG tablet One tab or 6 days, one half tablet for 6 days 9 tablet 0   No current facility-administered medications for this visit.    Allergies as of 04/30/2016  . (No Known Allergies)    Family History  Problem Relation Age of Onset  . Colon cancer Neg Hx   . Pancreatic cancer Neg Hx   . Stomach cancer Neg Hx     Social History   Social History  . Marital Status: Single   Spouse Name: N/A  . Number of Children: N/A  . Years of Education: N/A   Occupational History  . [employer]    Social History Main Topics  . Smoking status: Current Every Day Smoker -- 1.00 packs/day    Types: Cigarettes  . Smokeless tobacco: Not on file  . Alcohol Use: 0.0 oz/week    0 Standard drinks or equivalent per week     Comment: wine and beer. Drinks about a 6 pack of beer a day. About a 1/5 or more of wine a day.   . Drug Use: Yes     Comment: marijuana   . Sexual Activity: Not on file   Other Topics Concern  . Not on file   Social History Narrative    Review of Systems: Gen: see HPI  CV: Denies chest pain, heart palpitations, peripheral edema, syncope.  Resp: see HPI  GI: see HPI  GU : Denies urinary burning, urinary frequency, urinary hesitancy MS: Denies joint pain, muscle weakness, cramps, or limitation of movement.  Derm: Denies rash, itching, dry skin Psych: Denies depression, anxiety, memory loss, and confusion Heme: see HPI   Physical Exam: BP 173/94 mmHg  Pulse 84  Temp(Src) 98.9 F (37.2 C) (Oral)  Ht 5\' 11"  (1.803 m)  Wt 111 lb 6.4 oz (50.531 kg)  BMI 15.54 kg/m2 General:   Alert and oriented. Pleasant and cooperative. Cachectic-appearing  Head:  Normocephalic and atraumatic. Eyes:  Without icterus, sclera clear and conjunctiva pink.  Ears:  Normal auditory acuity. Nose:  No deformity, discharge,  or lesions. Mouth:  No deformity or lesions, oral mucosa pink.  Lungs:  Scattered rhonchi, coarse  Heart:  S1, S2 present without murmurs appreciated.  Abdomen:  +BS, soft, non-tender and non-distended. No HSM noted. No guarding or rebound. No masses appreciated.  Rectal:  Deferred  Msk:  Symmetrical without gross deformities. Normal posture. Extremities:  Without edema. Neurologic:  Alert and  oriented x4;  grossly normal neurologically. Psych:  Alert and cooperative. Normal mood and affect.  Lab Results  Component Value Date   WBC 5.5 04/23/2016    HGB 13.5 04/23/2016   HCT 39.5 04/23/2016   MCV 95.4 04/23/2016   PLT 315 04/23/2016   Lab Results  Component Value Date   ALT 7* 04/23/2016   AST 14* 04/23/2016   ALKPHOS 58 04/23/2016   BILITOT 0.4 04/23/2016   Lab Results  Component Value Date   TSH 0.266* 04/23/2016   Lab Results  Component Value Date   CREATININE 0.70 04/23/2016   BUN 6 04/23/2016   NA 141 04/23/2016   K 3.8 04/23/2016   CL 103 04/23/2016   CO2 30 04/23/2016

## 2016-04-30 NOTE — Assessment & Plan Note (Signed)
Unclear etiology. TCS/EGD as planned. TSH low, will copy to PCP (drawn in ED). May need abdominal imaging if TCS/EGD negative.

## 2016-04-30 NOTE — Assessment & Plan Note (Signed)
65 year old male with what appears to be progressive dysphagia that has worsened over the past 2-3 weeks. Associated weight loss over the past year of at least 40 lbs. No hematemesis noted. Esophagus normal on CT. No prior EGD or colonoscopy. No FH of colon cancer. Weight loss is quite concerning, and GI etiology needs to be ruled out. As of note, TSH low recently.   Proceed with colonoscopy/EGD/dilation with Dr. Oneida Alar in the near future. The risks, benefits, and alternatives have been discussed in detail with the patient. They state understanding and desire to proceed.  PROPOFOL due to ETOH abuse Discussed ETOH cessation: will likely need rehab/assistance for this Copy TSH results to PCP

## 2016-04-30 NOTE — Telephone Encounter (Signed)
done

## 2016-04-30 NOTE — Telephone Encounter (Signed)
Please send TSH results to PCP that are in epic from the ED. Thanks! I am not sure if they know about it or not.

## 2016-04-30 NOTE — Progress Notes (Signed)
CC'ED TO PCP 

## 2016-05-01 ENCOUNTER — Encounter (HOSPITAL_COMMUNITY): Payer: Self-pay

## 2016-05-01 ENCOUNTER — Encounter (HOSPITAL_COMMUNITY)
Admission: RE | Admit: 2016-05-01 | Discharge: 2016-05-01 | Disposition: A | Discharge status: Home / Self Care | Payer: Medicaid Other | Source: Ambulatory Visit | Attending: Gastroenterology | Admitting: Gastroenterology

## 2016-05-01 DIAGNOSIS — Z01818 Encounter for other preprocedural examination: Secondary | ICD-10-CM | POA: Insufficient documentation

## 2016-05-05 ENCOUNTER — Ambulatory Visit (HOSPITAL_COMMUNITY)
Admission: RE | Admit: 2016-05-05 | Discharge: 2016-05-05 | Disposition: A | Discharge status: Home / Self Care | Payer: Medicaid Other | Source: Ambulatory Visit | Attending: Gastroenterology | Admitting: Gastroenterology

## 2016-05-05 ENCOUNTER — Ambulatory Visit (HOSPITAL_COMMUNITY): Payer: Medicaid Other | Admitting: Anesthesiology

## 2016-05-05 ENCOUNTER — Encounter (HOSPITAL_COMMUNITY)
Admission: RE | Disposition: A | Discharge status: Home / Self Care | Payer: Self-pay | Source: Ambulatory Visit | Attending: Gastroenterology

## 2016-05-05 ENCOUNTER — Encounter (HOSPITAL_COMMUNITY): Payer: Self-pay

## 2016-05-05 ENCOUNTER — Telehealth: Payer: Self-pay | Admitting: Gastroenterology

## 2016-05-05 DIAGNOSIS — R634 Abnormal weight loss: Secondary | ICD-10-CM | POA: Diagnosis not present

## 2016-05-05 DIAGNOSIS — K648 Other hemorrhoids: Secondary | ICD-10-CM | POA: Insufficient documentation

## 2016-05-05 DIAGNOSIS — K222 Esophageal obstruction: Secondary | ICD-10-CM | POA: Insufficient documentation

## 2016-05-05 DIAGNOSIS — C12 Malignant neoplasm of pyriform sinus: Secondary | ICD-10-CM | POA: Diagnosis not present

## 2016-05-05 DIAGNOSIS — K2970 Gastritis, unspecified, without bleeding: Secondary | ICD-10-CM | POA: Diagnosis not present

## 2016-05-05 DIAGNOSIS — K644 Residual hemorrhoidal skin tags: Secondary | ICD-10-CM | POA: Diagnosis not present

## 2016-05-05 DIAGNOSIS — R1310 Dysphagia, unspecified: Secondary | ICD-10-CM | POA: Diagnosis present

## 2016-05-05 DIAGNOSIS — D120 Benign neoplasm of cecum: Secondary | ICD-10-CM | POA: Insufficient documentation

## 2016-05-05 DIAGNOSIS — K449 Diaphragmatic hernia without obstruction or gangrene: Secondary | ICD-10-CM | POA: Insufficient documentation

## 2016-05-05 DIAGNOSIS — Z1211 Encounter for screening for malignant neoplasm of colon: Secondary | ICD-10-CM | POA: Diagnosis not present

## 2016-05-05 DIAGNOSIS — I10 Essential (primary) hypertension: Secondary | ICD-10-CM | POA: Insufficient documentation

## 2016-05-05 DIAGNOSIS — Q438 Other specified congenital malformations of intestine: Secondary | ICD-10-CM | POA: Diagnosis not present

## 2016-05-05 DIAGNOSIS — K5730 Diverticulosis of large intestine without perforation or abscess without bleeding: Secondary | ICD-10-CM | POA: Insufficient documentation

## 2016-05-05 DIAGNOSIS — F17200 Nicotine dependence, unspecified, uncomplicated: Secondary | ICD-10-CM | POA: Insufficient documentation

## 2016-05-05 HISTORY — PX: POLYPECTOMY: SHX5525

## 2016-05-05 HISTORY — PX: COLONOSCOPY WITH PROPOFOL: SHX5780

## 2016-05-05 HISTORY — PX: ESOPHAGOGASTRODUODENOSCOPY (EGD) WITH PROPOFOL: SHX5813

## 2016-05-05 SURGERY — COLONOSCOPY WITH PROPOFOL
Anesthesia: Monitor Anesthesia Care

## 2016-05-05 MED ORDER — FENTANYL CITRATE (PF) 100 MCG/2ML IJ SOLN: 25.0000 ug | INTRAMUSCULAR | Status: DC | PRN

## 2016-05-05 MED ORDER — MIDAZOLAM HCL 2 MG/2ML IJ SOLN
INTRAMUSCULAR | Status: AC
  Filled 2016-05-05: qty 2

## 2016-05-05 MED ORDER — GLYCOPYRROLATE 0.2 MG/ML IJ SOLN
INTRAMUSCULAR | Status: AC
  Filled 2016-05-05: qty 1

## 2016-05-05 MED ORDER — PROPOFOL 10 MG/ML IV BOLUS
INTRAVENOUS | Status: AC
  Filled 2016-05-05: qty 40

## 2016-05-05 MED ORDER — LACTATED RINGERS IV SOLN
INTRAVENOUS | Status: DC
  Administered 2016-05-05: 07:00:00 via INTRAVENOUS

## 2016-05-05 MED ORDER — LIDOCAINE HCL (PF) 1 % IJ SOLN
INTRAMUSCULAR | Status: AC
  Filled 2016-05-05: qty 5

## 2016-05-05 MED ORDER — LIDOCAINE VISCOUS 2 % MT SOLN
15.0000 mL | Freq: Once | OROMUCOSAL | Status: AC
  Administered 2016-05-05: 15 mL via OROMUCOSAL

## 2016-05-05 MED ORDER — IPRATROPIUM-ALBUTEROL 0.5-2.5 (3) MG/3ML IN SOLN
RESPIRATORY_TRACT | Status: AC
  Filled 2016-05-05: qty 3

## 2016-05-05 MED ORDER — LIDOCAINE VISCOUS 2 % MT SOLN
OROMUCOSAL | Status: AC
Start: 2016-05-05 — End: 2016-05-05
  Filled 2016-05-05: qty 15

## 2016-05-05 MED ORDER — ONDANSETRON HCL 4 MG/2ML IJ SOLN: 4.0000 mg | Freq: Once | INTRAMUSCULAR | Status: DC | PRN

## 2016-05-05 MED ORDER — LIDOCAINE HCL (CARDIAC) 10 MG/ML IV SOLN
INTRAVENOUS | Status: DC | PRN
  Administered 2016-05-05: 25 mg via INTRAVENOUS

## 2016-05-05 MED ORDER — MIDAZOLAM HCL 5 MG/5ML IJ SOLN
INTRAMUSCULAR | Status: DC | PRN
  Administered 2016-05-05 (×2): 1 mg via INTRAVENOUS

## 2016-05-05 MED ORDER — MIDAZOLAM HCL 2 MG/2ML IJ SOLN
1.0000 mg | INTRAMUSCULAR | Status: DC | PRN
Start: 2016-05-05 — End: 2016-05-05
  Administered 2016-05-05 (×2): 2 mg via INTRAVENOUS
  Filled 2016-05-05: qty 2

## 2016-05-05 MED ORDER — IPRATROPIUM-ALBUTEROL 0.5-2.5 (3) MG/3ML IN SOLN
3.0000 mL | Freq: Once | RESPIRATORY_TRACT | Status: AC
  Administered 2016-05-05: 3 mL via RESPIRATORY_TRACT

## 2016-05-05 MED ORDER — PROPOFOL 500 MG/50ML IV EMUL
INTRAVENOUS | Status: DC | PRN
  Administered 2016-05-05: 10:00:00 via INTRAVENOUS
  Administered 2016-05-05: 150 ug/kg/min via INTRAVENOUS

## 2016-05-05 NOTE — Op Note (Signed)
[facility] Patient Name: Randy Blackwell Procedure Date: 05/05/2016 9:42 AM MRN: [id number] Date of Birth: May 24, 1951 Attending MD: Barney Drain , MD CSN: [id number] Age: 65 Admit Type: Outpatient Procedure:                Upper GI endoscopy Indications:              Dysphagia, Weight loss Providers:                Barney Drain, MD, Lurline Del, RN, Tammy Vaught, RN,                            Georgeann Oppenheim, Technician Referring MD:             [name], MD Medicines:                Propofol per Anesthesia Complications:            No immediate complications. Estimated Blood Loss:     Estimated blood loss was minimal. Procedure:                Pre-Anesthesia Assessment:                           - Prior to the procedure, a History and Physical                            was performed, and patient medications and                            allergies were reviewed. The patient's tolerance of                            previous anesthesia was also reviewed. The risks                            and benefits of the procedure and the sedation                            options and risks were discussed with the patient.                            All questions were answered, and informed consent                            was obtained. Prior Anticoagulants: The patient has                            taken aspirin, last dose was day of procedure. ASA                            Grade Assessment: II - A patient with mild systemic                            disease. After reviewing the risks and benefits,  the patient was deemed in satisfactory condition to                            undergo the procedure.                           After obtaining informed consent, the endoscope was                            passed under direct vision. Throughout the                            procedure, the patient's blood pressure, pulse, and   oxygen saturations were monitored continuously. The                            [id number] scope was introduced through the                            mouth, with the intention of advancing to the                            duodenum. The scope was advanced to the second part                            of the duodenum before the procedure was aborted.                            Medications were given. The upper GI endoscopy was                            technically difficult and complex due to a                            partially obstructing mass. Successful completion                            of the procedure was aided by withdrawing the scope                            and replacing with the pediatric endoscope. The                            procedure was aborted due to PT HAD DIFFICULTY                            HANDLING HIS SECRETIONS. NO BIOPSIES TAKEN                            [Maneuvers Attempted]. Scope In: 9:50:27 AM Scope Out: 9:53:18 AM Total Procedure Duration: 0 hours 2 minutes 51 seconds  Findings:      A large bloody, exophytic, friable, fungating and necrotic mass was       found in the left pyriform sinus.  The mass is partially obstructing the       airway.      One mild benign-appearing, intrinsic stenosis was found. And was       traversed.      A small hiatal hernia was present.      Scattered mild inflammation characterized by congestion (edema) and       erythema was found in the gastric body and in the gastric antrum.      The duodenal bulb and second portion of the duodenum were normal. 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                           - Benign-appearing esophageal stenosis.                            - Small hiatal hernia.                           - Gastritis.                           - Normal duodenal bulb and second portion of the                            duodenum.                           - No specimens collected. Moderate Sedation:      Per Anesthesia Care Recommendation:           - Patient has a contact number available for                            emergencies. The signs and symptoms of potential                            delayed complications were discussed with the                            patient. Return to normal activities tomorrow.                            Written discharge instructions were provided to the  patient.                           SEE AN ENT DOCTOR-[facility]-DR. DWIGHT D. BATES AT 2                            PM ON MAY 15-Address: [address],                            [place], [place]: [phone]. DRINK                            CARNATION INSTANT BREAKFAST IN A MILK SHAKE FOUR                            TIMES A DAY. DO NOT CONSUME SHAKES WITH CHUNKS IN                            THEM. IF YOU HAVE CHEST PAIN, OR SOB YOU SHOULD GO                            TO THE NEAREST ED.                           - Resume previous diet.                           - Continue present medications. 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 All rights reserved. The codes documented in this report are preliminary and upon coder review may  be revised to meet current compliance requirements. Barney Drain, MD Barney Drain, MD 05/05/2016 4:58:44 PM This report has been signed electronically. Number of Addenda: 0

## 2016-05-05 NOTE — Anesthesia Postprocedure Evaluation (Signed)
Anesthesia Post Note  Patient: Chrisangel Furfaro  Procedure(s) Performed: Procedure(s) (LRB): COLONOSCOPY WITH PROPOFOL (N/A) ESOPHAGOGASTRODUODENOSCOPY (EGD) WITH PROPOFOL (N/A) POLYPECTOMY  Patient location during evaluation: PACU Anesthesia Type: MAC Level of consciousness: awake and oriented Pain management: pain level controlled Vital Signs Assessment: post-procedure vital signs reviewed and stable Respiratory status: spontaneous breathing and respiratory function stable Cardiovascular status: stable Postop Assessment: no signs of nausea or vomiting Anesthetic complications: no    Last Vitals:  Filed Vitals:   05/05/16 0855 05/05/16 0900  BP: 177/97 179/93  Pulse:    Temp:    Resp: 14 23    Last Pain: There were no vitals filed for this visit.               Dickie Cloe A

## 2016-05-05 NOTE — Anesthesia Preprocedure Evaluation (Signed)
Anesthesia Evaluation  Patient identified by MRN, date of birth, ID band Patient awake    Reviewed: Allergy & Precautions, NPO status , Patient's Chart, lab work & pertinent test results  Airway Mallampati: II       Dental  (+) Edentulous Upper, Edentulous Lower   Pulmonary Current Smoker,     + decreased breath sounds      Cardiovascular hypertension, Normal cardiovascular exam     Neuro/Psych    GI/Hepatic (+)     substance abuse  alcohol use and marijuana use,   Endo/Other    Renal/GU      Musculoskeletal   Abdominal (+) + scaphoid   Peds  Hematology   Anesthesia Other Findings   Reproductive/Obstetrics                             Anesthesia Physical Anesthesia Plan  ASA: III  Anesthesia Plan: MAC   Post-op Pain Management:    Induction: Intravenous  Airway Management Planned: Nasal Cannula  Additional Equipment:   Intra-op Plan:   Post-operative Plan:   Informed Consent: I have reviewed the patients History and Physical, chart, labs and discussed the procedure including the risks, benefits and alternatives for the proposed anesthesia with the patient or authorized representative who has indicated his/her understanding and acceptance.     Plan Discussed with: CRNA  Anesthesia Plan Comments:         Anesthesia Quick Evaluation

## 2016-05-05 NOTE — H&P (Addendum)
  Primary Care Physician:  [name], MD Primary Gastroenterologist:  Dr. Oneida Alar  Pre-Procedure History & Physical: HPI:  Chief Walkup is a 65 y.o. male here for  Anorexia/weight loss/dysphagia-2015 130 LBS.   Past Medical History  Diagnosis Date  . Hypertension     per PCP notes  . Seizures ([place])     per PCP notes    Past Surgical History  Procedure Laterality Date  . None    . No past surgeries      Prior to Admission medications   Medication Sig Start Date End Date Taking? Authorizing Provider  aspirin-sod bicarb-citric acid (ALKA-SELTZER) 325 MG TBEF tablet Take 325 mg by mouth every 6 (six) hours as needed.   Yes Historical Provider, MD  levofloxacin (LEVAQUIN) 500 MG tablet Take 1 tablet (500 mg total) by mouth daily. 04/23/16  Yes [name], MD  Na Sulfate-K Sulfate-Mg Sulf SOLN Take 1 kit by mouth once. 04/30/16 05/30/16 Yes Orvil Feil, NP  predniSONE (DELTASONE) 50 MG tablet One tab or 6 days, one half tablet for 6 days 04/23/16  Yes [name], MD    Allergies as of 04/30/2016  . (No Known Allergies)    Family History  Problem Relation Age of Onset  . Colon cancer Neg Hx   . Pancreatic cancer Neg Hx   . Stomach cancer Neg Hx     Social History   Social History  . Marital Status: Single    Spouse Name: N/A  . Number of Children: N/A  . Years of Education: N/A   Occupational History  . [employer]    Social History Main Topics  . Smoking status: Current Every Day Smoker -- 1.00 packs/day    Types: Cigarettes  . Smokeless tobacco: Not on file  . Alcohol Use: 0.0 oz/week    0 Standard drinks or equivalent per week     Comment: wine and beer. Drinks about a 6 pack of beer a day. About a 1/5 or more of wine a day.   . Drug Use: Yes    Special: Marijuana     Comment: marijuana   . Sexual Activity: Not on file   Other Topics Concern  . Not on file   Social History Narrative    Review of Systems: See HPI, otherwise negative ROS   Physical Exam: BP  178/95 mmHg  Pulse 65  Temp(Src) 98.7 F (37.1 C) (Oral)  Resp 18  SpO2 100% General:   Alert,  pleasant and cooperative in NAD Head:  Normocephalic and atraumatic. Neck:  Supple; Lungs:  Clear throughout to auscultation.    Heart:  Regular rate and rhythm. Abdomen:  Soft, nontender and nondistended. Normal bowel sounds, without guarding, and without rebound.   Neurologic:  Alert and  oriented x4;  grossly normal neurologically. EXTREMITIES: CLUBBING PRESENT  Impression/Plan:     DYSPHAGIA/weight loss/screening  PLAN:  EGD/possible DIL/tcs TODAY

## 2016-05-05 NOTE — Transfer of Care (Signed)
Immediate Anesthesia Transfer of Care Note  Patient: Randy Blackwell  Procedure(s) Performed: Procedure(s) with comments: COLONOSCOPY WITH PROPOFOL (N/A) - 0730 ESOPHAGOGASTRODUODENOSCOPY (EGD) WITH PROPOFOL (N/A) POLYPECTOMY - cecal polyp cold bx,  Patient Location: PACU  Anesthesia Type:MAC  Level of Consciousness: awake, oriented and patient cooperative  Airway & Oxygen Therapy: Patient Spontanous Breathing and Patient connected to face mask oxygen  Post-op Assessment: Report given to RN and Post -op Vital signs reviewed and stable  Post vital signs: Reviewed and stable  Last Vitals:  Filed Vitals:   05/05/16 0855 05/05/16 0900  BP: 177/97 179/93  Pulse:    Temp:    Resp: 14 23    Last Pain: There were no vitals filed for this visit.    Patients Stated Pain Goal: 5 ([id number])  Complications: No apparent anesthesia complications

## 2016-05-05 NOTE — Progress Notes (Signed)
REVIEWED-NO ADDITIONAL RECOMMENDATIONS. 

## 2016-05-05 NOTE — Telephone Encounter (Signed)
PT HAS MASS IN HYPOPHARYNX. NEEDS APPT ASAP WITH [facility]-DR. DWIGHT D. BATES. SPOKE WITH HIM TODAY. DIDN'T FEEL PT NEEDED ADMISSION. PT GIVEN PH# AND ADDRESS.  PT HAS APPT. 2 PM ON MAY 15-[place].  FAX ALL RECORDS TO [facility].

## 2016-05-05 NOTE — Discharge Instructions (Signed)
YOU HAVE A MASS IN YOUR THROAT. You had 2 polyps removed. You have internal hemorrhoids & DIVERTICULOSIS IN YOUR LEFT AND RIGHT COLON. You have AN ESOPHAGEAL RING, gastritis, & a SMALL HIATAL HERNIA.    YOU NEED TO SEE AN ENT DOCTOR-[facility]-DR. DWIGHT D. BATES AT 2 PM ON MAY 15-Address: [address], [place], [place]: [phone]  FOLLOW A FULL LIQUID DIET. SEE INFO BELOW. DRINK CARNATION INSTANT BREAKFAST IN A MILK SHAKE FOUR TIMES A DAY. DO NOT CONSUME SHAKES WITH CHUNKS IN THEM. IF YOU HAVE CHEST PAIN, OR SOB YOU SHOULD GO TO THE NEAREST ED.  YOUR BIOPSY RESULTS FROM YOUR COLON POLYPS WILL BE AVAILABLE IN MY CHART MAY 12 AND MY OFFICE WILL CONTACT YOU IN 10-14 DAYS WITH YOUR RESULTS.       ENDOSCOPY Care After Read the instructions outlined below and refer to this sheet in the next week. These discharge instructions provide you with general information on caring for yourself after you leave the hospital. While your treatment has been planned according to the most current medical practices available, unavoidable complications occasionally occur. If you have any problems or questions after discharge, call DR. Kris No, [phone].  ACTIVITY  You may resume your regular activity, but move at a slower pace for the next 24 hours.   Take frequent rest periods for the next 24 hours.   Walking will help get rid of the air and reduce the bloated feeling in your belly (abdomen).   No driving for 24 hours (because of the medicine (anesthesia) used during the test).   You may shower.   Do not sign any important legal documents or operate any machinery for 24 hours (because of the anesthesia used during the test).    NUTRITION  Drink plenty of fluids.   You may resume your normal diet as instructed by your doctor.   Begin with a light meal and progress to your normal diet. Heavy or fried foods are harder to digest and may make you feel sick to your stomach  (nauseated).   Avoid alcoholic beverages for 24 hours or as instructed.    MEDICATIONS  You may resume your normal medications.   WHAT YOU CAN EXPECT TODAY  Some feelings of bloating in the abdomen.   Passage of more gas than usual.   Spotting of blood in your stool or on the toilet paper  .  IF YOU HAD POLYPS REMOVED DURING THE ENDOSCOPY:  Eat a soft diet IF YOU HAVE NAUSEA, BLOATING, ABDOMINAL PAIN, OR VOMITING.    FINDING OUT THE RESULTS OF YOUR TEST Not all test results are available during your visit. DR. Oneida Alar WILL CALL YOU WITHIN 7 DAYS OF YOUR PROCEDUE WITH YOUR RESULTS. Do not assume everything is normal if you have not heard from DR. Janssen Zee IN ONE WEEK, CALL HER OFFICE AT [phone].  SEEK IMMEDIATE MEDICAL ATTENTION AND CALL THE OFFICE: [phone] IF:  You have more than a spotting of blood in your stool.   Your belly is swollen (abdominal distention).   You are nauseated or vomiting.   You have a temperature over 101F.   You have abdominal pain or discomfort that is severe or gets worse throughout the day.   Full Liquid Diet A high-calorie, high-protein supplement should be used to meet your nutritional requirements when the full liquid diet is continued for more than 2 or 3 days. If this diet is to be used for an extended period of  time (more than 7 days), a multivitamin should be considered.    Vegetables  Allowed: Strained tomato or vegetable juice. Vegetables pureed in soup.   Avoid: Any others.    Fruit  Allowed: Any strained fruit juices and fruit drinks. Include 1 serving of citrus or vitamin C-enriched fruit juice daily.   Avoid: Any others.   Milk  Allowed: SOY Milk beverages, including milk shakes and instant breakfast mixes.   Avoid: Any others. Avoid dairy products if not tolerated.    Soups and Combination Foods  Allowed: Broth, strained cream soups. Strained, broth-based soups.   Avoid: Any others.    Desserts and  Sweets  Allowed: flavored gelatin, tapioca, ice cream, sherbet, smooth pudding, junket, fruit ices, frozen ice pops, pudding pops, frozen fudge pops, chocolate syrup. Sugar, honey, jelly, syrup.   Avoid: Any others.  Fats and Oils  Allowed: Margarine, butter, cream, sour cream, oils.   Avoid: Any others.  Beverages  Allowed: All.   Avoid: None.  Condiments  Allowed: Iodized salt, pepper, spices, flavorings. Cocoa powder.   Avoid: Any others.

## 2016-05-05 NOTE — Telephone Encounter (Signed)
Noted will fax records when EGD note is complete

## 2016-05-05 NOTE — Op Note (Signed)
[facility] Patient Name: Randy Blackwell Procedure Date: 05/05/2016 9:07 AM MRN: [id number] Date of Birth: 1951-10-09 Attending MD: Barney Drain , MD CSN: [id number] Age: 65 Admit Type: Outpatient Procedure:                Colonoscopy Indications:              Screening for colorectal malignant neoplasm Providers:                Barney Drain, MD, Lurline Del, RN, Tammy Vaught, RN,                            Georgeann Oppenheim, Technician Referring MD:             [name], MD Medicines:                Propofol per Anesthesia Complications:            No immediate complications. Estimated Blood Loss:     Estimated blood loss was minimal. Procedure:                Pre-Anesthesia Assessment:                           - Prior to the procedure, a History and Physical                            was performed, and patient medications and                            allergies were reviewed. The patient's tolerance of                            previous anesthesia was also reviewed. The risks                            and benefits of the procedure and the sedation                            options and risks were discussed with the patient.                            All questions were answered, and informed consent                            was obtained. Prior Anticoagulants: The patient has                            taken aspirin, last dose was day of procedure. ASA                            Grade Assessment: II - A patient with mild systemic                            disease. After reviewing the risks and benefits,  the patient was deemed in satisfactory condition to                            undergo the procedure.                           After obtaining informed consent, the colonoscope                            was passed under direct vision. Throughout the                            procedure, the patient's blood pressure, pulse, and                             oxygen saturations were monitored continuously. The                            [id number] scope was introduced through                            the anus and advanced to the the cecum, identified                            by appendiceal orifice and ileocecal valve. The                            ileocecal valve, appendiceal orifice, and rectum                            were photographed. The colonoscopy was somewhat                            difficult due to a tortuous colon. Successful                            completion of the procedure was aided by using                            manual pressure. The quality of the bowel                            preparation was poor. Scope In: 9:19:58 AM Scope Out: 9:38:56 AM Scope Withdrawal Time: 0 hours 10 minutes 34 seconds  Total Procedure Duration: 0 hours 18 minutes 58 seconds  Findings:      Many small and large-mouthed diverticula were found in the entire colon.      Two sessile polyps were found in the cecum. The polyps were 2 to 4 mm in       size. These polyps were removed with a cold biopsy forceps. Resection       and retrieval were complete.      The recto-sigmoid colon was moderately redundant.      Non-bleeding external and internal hemorrhoids were found. The       hemorrhoids were large.  Impression:               - Preparation of the colon was poor.                           - Diverticulosis in the entire examined colon.                           - Two 2 to 4 mm polyps in the cecum, removed with a                            cold biopsy forceps. Resected and retrieved.                           - Redundant colon.                           - Non-bleeding external and internal hemorrhoids. Moderate Sedation:      Per Anesthesia Care Recommendation:           - Full liquid diet.                           - Continue present medications.                           - Await pathology results.                            - Repeat colonoscopy because the bowel preparation                            was poor.                           SEE AN ENT DOCTOR-[facility]-DR. DWIGHT D. BATES AT 2                            PM ON MAY 15-Address: [address],                            [place], [place] [address];Phone: [phone]                           DRINK CARNATION INSTANT BREAKFAST IN A MILK SHAKE                            FOUR TIMES A DAY. DO NOT CONSUME SHAKES WITH CHUNKS                            IN THEM. IF YOU HAVE CHEST PAIN, OR SOB YOU SHOULD                            GO TO THE NEAREST ED.                           -  Patient has a contact number available for                            emergencies. The signs and symptoms of potential                            delayed complications were discussed with the                            patient. Return to normal activities tomorrow.                            Written discharge instructions were provided to the                            patient. Procedure Code(s):        --- Professional ---                           [phone], Colonoscopy, flexible; with biopsy, single                            or multiple Diagnosis Code(s):        --- Professional ---                           Z12.11, Encounter for screening for malignant                            neoplasm of colon                           K64.8, Other hemorrhoids                           D12.0, Benign neoplasm of cecum                           K57.30, Diverticulosis of large intestine without                            perforation or abscess without bleeding                           Q43.8, Other specified congenital malformations of                            intestine CPT copyright 2016 American Medical Association. All rights reserved. The codes documented in this report are preliminary and upon coder review may  be revised to meet current compliance requirements. Barney Drain,  MD Barney Drain, MD 05/05/2016 4:47:24 PM This report has been signed electronically. Number of Addenda: 0

## 2016-05-06 NOTE — Telephone Encounter (Signed)
Records faxed to [facility]

## 2016-05-07 ENCOUNTER — Encounter (HOSPITAL_COMMUNITY): Payer: Self-pay | Admitting: Gastroenterology

## 2016-05-14 ENCOUNTER — Other Ambulatory Visit (HOSPITAL_COMMUNITY): Payer: Self-pay | Admitting: Otolaryngology

## 2016-05-14 DIAGNOSIS — J392 Other diseases of pharynx: Secondary | ICD-10-CM

## 2016-05-17 ENCOUNTER — Telehealth: Payer: Self-pay | Admitting: Gastroenterology

## 2016-05-17 NOTE — Telephone Encounter (Signed)
Please call pt. HE had simple adenomas removed from HIS colon. HIS EKG SHOWS CHANGES THAT SUGGEST HE MAY HAD HEART DISEASE.  HE SHOULD DISCUSS THE EKG CHANGES WITH IS PCP.  HE SHOULD COMPLETE CT SCANS FOR THE ENT DOCTORS.   FOLLOW A FULL LIQUID DIET. DRINK CARNATION INSTANT BREAKFAST IN A MILK SHAKE FOUR TIMES A DAY. DO NOT CONSUME SHAKES WITH CHUNKS IN THEM. IF YOU HAVE CHEST PAIN, OR SOB YOU SHOULD GO TO THE NEAREST ED.  NEXT TCS IN 5-10 YEARS.

## 2016-05-18 NOTE — Telephone Encounter (Signed)
Called pt and LMOM to call our office  

## 2016-05-18 NOTE — Telephone Encounter (Signed)
ON RECALL  °

## 2016-05-19 NOTE — Telephone Encounter (Signed)
Spoke to pt and his brother Jenny Reichmann and made them aware.

## 2016-05-19 NOTE — Telephone Encounter (Signed)
LMOM to call and mailing a letter to call also.

## 2016-05-20 ENCOUNTER — Ambulatory Visit (HOSPITAL_COMMUNITY): Payer: Medicaid Other

## 2016-05-21 ENCOUNTER — Other Ambulatory Visit: Payer: Self-pay | Admitting: Otolaryngology

## 2016-05-21 ENCOUNTER — Other Ambulatory Visit (HOSPITAL_COMMUNITY): Payer: Medicaid Other

## 2016-05-21 ENCOUNTER — Encounter (HOSPITAL_COMMUNITY): Payer: Self-pay | Admitting: *Deleted

## 2016-05-21 ENCOUNTER — Other Ambulatory Visit (HOSPITAL_COMMUNITY): Payer: Self-pay | Admitting: Otolaryngology

## 2016-05-21 DIAGNOSIS — R1310 Dysphagia, unspecified: Secondary | ICD-10-CM

## 2016-05-21 NOTE — Progress Notes (Signed)
Pt denies cardiac history, chest pain or sob. Has a hx of HTN but states he's never been treated for it.

## 2016-05-22 ENCOUNTER — Inpatient Hospital Stay (HOSPITAL_COMMUNITY): Payer: Medicaid Other | Admitting: Anesthesiology

## 2016-05-22 ENCOUNTER — Observation Stay (HOSPITAL_COMMUNITY)
Admission: RE | Admit: 2016-05-22 | Discharge: 2016-05-23 | Disposition: A | Discharge status: Home / Self Care | Payer: Medicaid Other | Source: Ambulatory Visit | Attending: Otolaryngology | Admitting: Otolaryngology

## 2016-05-22 ENCOUNTER — Encounter (HOSPITAL_COMMUNITY)
Admission: RE | Disposition: A | Discharge status: Home / Self Care | Payer: Self-pay | Source: Ambulatory Visit | Attending: Otolaryngology

## 2016-05-22 ENCOUNTER — Encounter (HOSPITAL_COMMUNITY): Payer: Self-pay

## 2016-05-22 ENCOUNTER — Encounter (HOSPITAL_COMMUNITY): Payer: Self-pay | Admitting: *Deleted

## 2016-05-22 ENCOUNTER — Observation Stay (HOSPITAL_COMMUNITY)
Admission: RE | Admit: 2016-05-22 | Discharge: 2016-05-22 | Disposition: A | Discharge status: Home / Self Care | Payer: Medicaid Other | Source: Ambulatory Visit | Attending: Otolaryngology | Admitting: Otolaryngology

## 2016-05-22 DIAGNOSIS — R1310 Dysphagia, unspecified: Secondary | ICD-10-CM

## 2016-05-22 DIAGNOSIS — C139 Malignant neoplasm of hypopharynx, unspecified: Secondary | ICD-10-CM

## 2016-05-22 DIAGNOSIS — Z23 Encounter for immunization: Secondary | ICD-10-CM | POA: Diagnosis not present

## 2016-05-22 DIAGNOSIS — F17210 Nicotine dependence, cigarettes, uncomplicated: Secondary | ICD-10-CM | POA: Diagnosis not present

## 2016-05-22 DIAGNOSIS — C140 Malignant neoplasm of pharynx, unspecified: Principal | ICD-10-CM | POA: Insufficient documentation

## 2016-05-22 DIAGNOSIS — K219 Gastro-esophageal reflux disease without esophagitis: Secondary | ICD-10-CM | POA: Insufficient documentation

## 2016-05-22 DIAGNOSIS — I10 Essential (primary) hypertension: Secondary | ICD-10-CM | POA: Insufficient documentation

## 2016-05-22 HISTORY — PX: DIRECT LARYNGOSCOPY: SHX5326

## 2016-05-22 HISTORY — PX: ESOPHAGOSCOPY: SHX5534

## 2016-05-22 HISTORY — DX: Gastro-esophageal reflux disease without esophagitis: K21.9

## 2016-05-22 HISTORY — DX: Reserved for inherently not codable concepts without codable children: IMO0001

## 2016-05-22 LAB — BASIC METABOLIC PANEL
Anion gap: 14 (ref 5–15)
BUN: 25 mg/dL — AB (ref 6–20)
CHLORIDE: 96 mmol/L — AB (ref 101–111)
CO2: 26 mmol/L (ref 22–32)
CREATININE: 0.97 mg/dL (ref 0.61–1.24)
Calcium: 10 mg/dL (ref 8.9–10.3)
GFR calc non Af Amer: >60 mL/min (ref >60)
Glucose, Bld: 126 mg/dL — ABNORMAL HIGH (ref 65–99)
Potassium: 4.3 mmol/L (ref 3.5–5.1)
Sodium: 136 mmol/L (ref 135–145)

## 2016-05-22 LAB — CBC
HEMATOCRIT: 45.9 % (ref 39.0–52.0)
HEMOGLOBIN: 15.6 g/dL (ref 13.0–17.0)
MCH: 31.4 pg (ref 26.0–34.0)
MCHC: 34 g/dL (ref 30.0–36.0)
MCV: 92.4 fL (ref 78.0–100.0)
Platelets: 360 10*3/uL (ref 150–400)
RBC: 4.97 MIL/uL (ref 4.22–5.81)
RDW: 13.5 % (ref 11.5–15.5)
WBC: 7.7 10*3/uL (ref 4.0–10.5)

## 2016-05-22 LAB — PROTIME-INR
INR: 1.16 (ref 0.00–1.49)
PROTHROMBIN TIME: 15 s (ref 11.6–15.2)

## 2016-05-22 LAB — GLUCOSE, CAPILLARY: GLUCOSE-CAPILLARY: 123 mg/dL — AB (ref 65–99)

## 2016-05-22 SURGERY — LARYNGOSCOPY, DIRECT
Anesthesia: General

## 2016-05-22 MED ORDER — LIDOCAINE HCL 1 % IJ SOLN
INTRAMUSCULAR | Status: AC
  Administered 2016-05-22: 10 mL
  Filled 2016-05-22: qty 20

## 2016-05-22 MED ORDER — FENTANYL CITRATE (PF) 100 MCG/2ML IJ SOLN
INTRAMUSCULAR | Status: AC | PRN
  Administered 2016-05-22: 25 ug via INTRAVENOUS

## 2016-05-22 MED ORDER — EPINEPHRINE HCL (NASAL) 0.1 % NA SOLN
NASAL | Status: AC
  Filled 2016-05-22: qty 30

## 2016-05-22 MED ORDER — SUCCINYLCHOLINE CHLORIDE 20 MG/ML IJ SOLN
INTRAMUSCULAR | Status: DC | PRN
  Administered 2016-05-22: 100 mg via INTRAVENOUS

## 2016-05-22 MED ORDER — MIDAZOLAM HCL 2 MG/2ML IJ SOLN
INTRAMUSCULAR | Status: AC
  Filled 2016-05-22: qty 2

## 2016-05-22 MED ORDER — PROPOFOL 10 MG/ML IV BOLUS
INTRAVENOUS | Status: AC
  Filled 2016-05-22: qty 20

## 2016-05-22 MED ORDER — CEFAZOLIN SODIUM 1-5 GM-% IV SOLN
INTRAVENOUS | Status: AC
  Administered 2016-05-22: 1 g
  Filled 2016-05-22: qty 50

## 2016-05-22 MED ORDER — ONDANSETRON HCL 4 MG/2ML IJ SOLN: INTRAMUSCULAR | Status: DC | PRN

## 2016-05-22 MED ORDER — HYDROMORPHONE HCL 1 MG/ML IJ SOLN
1.0000 mg | INTRAMUSCULAR | Status: DC | PRN
  Administered 2016-05-22 (×3): 1 mg via INTRAVENOUS
  Filled 2016-05-22 (×3): qty 1

## 2016-05-22 MED ORDER — GLUCAGON HCL RDNA (DIAGNOSTIC) 1 MG IJ SOLR
INTRAMUSCULAR | Status: AC
  Filled 2016-05-22: qty 1

## 2016-05-22 MED ORDER — PNEUMOCOCCAL VAC POLYVALENT 25 MCG/0.5ML IJ INJ
0.5000 mL | INJECTION | INTRAMUSCULAR | Status: AC
  Administered 2016-05-23: 0.5 mL via INTRAMUSCULAR
  Filled 2016-05-22: qty 0.5

## 2016-05-22 MED ORDER — LACTATED RINGERS IV SOLN
INTRAVENOUS | Status: DC
  Administered 2016-05-22 (×2): via INTRAVENOUS

## 2016-05-22 MED ORDER — KCL IN DEXTROSE-NACL 20-5-0.45 MEQ/L-%-% IV SOLN
INTRAVENOUS | Status: AC
  Filled 2016-05-22: qty 1000

## 2016-05-22 MED ORDER — JEVITY 1.2 CAL PO LIQD
1000.0000 mL | ORAL | Status: DC
  Filled 2016-05-22 (×2): qty 1000

## 2016-05-22 MED ORDER — SUCCINYLCHOLINE CHLORIDE 200 MG/10ML IV SOSY
PREFILLED_SYRINGE | INTRAVENOUS | Status: AC
  Filled 2016-05-22: qty 10

## 2016-05-22 MED ORDER — ONDANSETRON HCL 4 MG/2ML IJ SOLN: 4.0000 mg | INTRAMUSCULAR | Status: DC | PRN

## 2016-05-22 MED ORDER — MORPHINE SULFATE (PF) 2 MG/ML IV SOLN: 2.0000 mg | INTRAVENOUS | Status: DC | PRN

## 2016-05-22 MED ORDER — KCL IN DEXTROSE-NACL 20-5-0.45 MEQ/L-%-% IV SOLN
INTRAVENOUS | Status: DC
  Administered 2016-05-22 (×2): via INTRAVENOUS
  Filled 2016-05-22 (×2): qty 1000

## 2016-05-22 MED ORDER — CHLORHEXIDINE GLUCONATE 0.12 % MT SOLN
15.0000 mL | Freq: Two times a day (BID) | OROMUCOSAL | Status: DC
  Administered 2016-05-22 – 2016-05-23 (×3): 15 mL via OROMUCOSAL
  Filled 2016-05-22 (×2): qty 15

## 2016-05-22 MED ORDER — ONDANSETRON HCL 4 MG PO TABS: 4.0000 mg | ORAL_TABLET | ORAL | Status: DC | PRN

## 2016-05-22 MED ORDER — PROPOFOL 10 MG/ML IV BOLUS
INTRAVENOUS | Status: DC | PRN
  Administered 2016-05-22: 130 mg via INTRAVENOUS

## 2016-05-22 MED ORDER — LIDOCAINE 2% (20 MG/ML) 5 ML SYRINGE
INTRAMUSCULAR | Status: AC
  Filled 2016-05-22: qty 5

## 2016-05-22 MED ORDER — LIDOCAINE 2% (20 MG/ML) 5 ML SYRINGE
INTRAMUSCULAR | Status: DC | PRN
  Administered 2016-05-22: 40 mg via INTRAVENOUS

## 2016-05-22 MED ORDER — MIDAZOLAM HCL 5 MG/5ML IJ SOLN
INTRAMUSCULAR | Status: DC | PRN
  Administered 2016-05-22: 1 mg via INTRAVENOUS

## 2016-05-22 MED ORDER — ROCURONIUM BROMIDE 50 MG/5ML IV SOLN
INTRAVENOUS | Status: AC
  Filled 2016-05-22: qty 1

## 2016-05-22 MED ORDER — FENTANYL CITRATE (PF) 250 MCG/5ML IJ SOLN
INTRAMUSCULAR | Status: AC
  Filled 2016-05-22: qty 5

## 2016-05-22 MED ORDER — OXYMETAZOLINE HCL 0.05 % NA SOLN
NASAL | Status: AC
  Filled 2016-05-22: qty 15

## 2016-05-22 MED ORDER — CETYLPYRIDINIUM CHLORIDE 0.05 % MT LIQD
7.0000 mL | Freq: Two times a day (BID) | OROMUCOSAL | Status: DC
  Administered 2016-05-22: 7 mL via OROMUCOSAL

## 2016-05-22 MED ORDER — SUGAMMADEX SODIUM 200 MG/2ML IV SOLN
INTRAVENOUS | Status: AC
  Filled 2016-05-22: qty 2

## 2016-05-22 MED ORDER — ONDANSETRON HCL 4 MG/2ML IJ SOLN
INTRAMUSCULAR | Status: DC | PRN
  Administered 2016-05-22: 4 mg via INTRAVENOUS

## 2016-05-22 MED ORDER — PHENYLEPHRINE 40 MCG/ML (10ML) SYRINGE FOR IV PUSH (FOR BLOOD PRESSURE SUPPORT)
PREFILLED_SYRINGE | INTRAVENOUS | Status: AC
  Filled 2016-05-22: qty 10

## 2016-05-22 MED ORDER — HYDROCODONE-ACETAMINOPHEN 7.5-325 MG/15ML PO SOLN
10.0000 mL | ORAL | Status: DC | PRN
Start: 2016-05-22 — End: 2016-05-23

## 2016-05-22 MED ORDER — PROPOFOL 1000 MG/100ML IV EMUL
INTRAVENOUS | Status: AC
  Filled 2016-05-22: qty 100

## 2016-05-22 MED ORDER — PHENYLEPHRINE HCL 10 MG/ML IJ SOLN
INTRAMUSCULAR | Status: DC | PRN
  Administered 2016-05-22: 120 ug via INTRAVENOUS
  Administered 2016-05-22: 80 ug via INTRAVENOUS

## 2016-05-22 MED ORDER — MIDAZOLAM HCL 2 MG/2ML IJ SOLN
INTRAMUSCULAR | Status: AC | PRN
  Administered 2016-05-22: 1 mg via INTRAVENOUS

## 2016-05-22 MED ORDER — CEFAZOLIN SODIUM 1-5 GM-% IV SOLN: 1.0000 g | INTRAVENOUS | Status: DC

## 2016-05-22 MED ORDER — SUGAMMADEX SODIUM 200 MG/2ML IV SOLN
INTRAVENOUS | Status: DC | PRN
  Administered 2016-05-22: 100 mg via INTRAVENOUS

## 2016-05-22 MED ORDER — FENTANYL CITRATE (PF) 100 MCG/2ML IJ SOLN
INTRAMUSCULAR | Status: DC | PRN
  Administered 2016-05-22 (×2): 50 ug via INTRAVENOUS

## 2016-05-22 MED ORDER — ONDANSETRON HCL 4 MG/2ML IJ SOLN
INTRAMUSCULAR | Status: AC
  Filled 2016-05-22: qty 2

## 2016-05-22 MED ORDER — FENTANYL CITRATE (PF) 100 MCG/2ML IJ SOLN: 25.0000 ug | INTRAMUSCULAR | Status: DC | PRN

## 2016-05-22 MED ORDER — FENTANYL CITRATE (PF) 100 MCG/2ML IJ SOLN
INTRAMUSCULAR | Status: AC
Start: 2016-05-22 — End: 2016-05-23
  Filled 2016-05-22: qty 2

## 2016-05-22 MED ORDER — 0.9 % SODIUM CHLORIDE (POUR BTL) OPTIME
TOPICAL | Status: DC | PRN
  Administered 2016-05-22: 1000 mL

## 2016-05-22 MED ORDER — IOPAMIDOL (ISOVUE-300) INJECTION 61%
INTRAVENOUS | Status: AC
  Administered 2016-05-22: 10 mL
  Filled 2016-05-22: qty 50

## 2016-05-22 MED ORDER — ROCURONIUM BROMIDE 100 MG/10ML IV SOLN
INTRAVENOUS | Status: DC | PRN
  Administered 2016-05-22: 25 mg via INTRAVENOUS

## 2016-05-22 SURGICAL SUPPLY — 18 items
CANISTER SUCTION 2500CC (MISCELLANEOUS) ×3 IMPLANT
CONT SPEC 4OZ CLIKSEAL STRL BL (MISCELLANEOUS) IMPLANT
COVER MAYO STAND STRL (DRAPES) ×3 IMPLANT
COVER TABLE BACK 60X90 (DRAPES) ×3 IMPLANT
CRADLE DONUT ADULT HEAD (MISCELLANEOUS) ×3 IMPLANT
DRAPE PROXIMA HALF (DRAPES) ×3 IMPLANT
GAUZE SPONGE 4X4 16PLY XRAY LF (GAUZE/BANDAGES/DRESSINGS) ×3 IMPLANT
GLOVE BIO SURGEON STRL SZ7.5 (GLOVE) ×3 IMPLANT
GOWN STRL REUS W/ TWL LRG LVL3 (GOWN DISPOSABLE) ×2 IMPLANT
GOWN STRL REUS W/TWL LRG LVL3 (GOWN DISPOSABLE) ×4
KIT ROOM TURNOVER OR (KITS) ×3 IMPLANT
NS IRRIG 1000ML POUR BTL (IV SOLUTION) ×3 IMPLANT
PAD ARMBOARD 7.5X6 YLW CONV (MISCELLANEOUS) ×6 IMPLANT
SOLUTION ANTI FOG 6CC (MISCELLANEOUS) IMPLANT
SURGILUBE 2OZ TUBE FLIPTOP (MISCELLANEOUS) ×3 IMPLANT
TOWEL OR 17X24 6PK STRL BLUE (TOWEL DISPOSABLE) ×6 IMPLANT
TUBE CONNECTING 12'X1/4 (SUCTIONS) ×1
TUBE CONNECTING 12X1/4 (SUCTIONS) ×2 IMPLANT

## 2016-05-22 NOTE — Procedures (Signed)
14F gastrostomy tube placement under fluoro No complication No blood loss. See complete dictation in [facility] PACS.  

## 2016-05-22 NOTE — Progress Notes (Signed)
Taught with teach back how to flush g-tube with family and patient. Abd binder placed on patient.

## 2016-05-22 NOTE — Progress Notes (Signed)
This nurse called and spoke to [place], [place] in PACU. Per Michel Bickers, PA, pt needs to remain NPO in PACU for G tube placement with IR later today.

## 2016-05-22 NOTE — Brief Op Note (Signed)
05/22/2016  11:25 AM  PATIENT:  Randy Blackwell  65 y.o. male  PRE-OPERATIVE DIAGNOSIS:  Pharyngeal mass, dysphagia  POST-OPERATIVE DIAGNOSIS:  Pharyngeal mass, dysphagia  PROCEDURE:  Procedure(s) with comments: DIRECT LARYNGOSCOPY (N/A) - direct laryngoscopy with biopsy ESOPHAGOSCOPY (N/A)  SURGEON:  Surgeon(s) and Role:    * Melida Quitter, MD - Primary  PHYSICIAN ASSISTANT:   ASSISTANTS: none   ANESTHESIA:   general  EBL:     BLOOD ADMINISTERED:none  DRAINS: none   LOCAL MEDICATIONS USED:  NONE  SPECIMEN:  Source of Specimen:  Posterior pharyngeal wall mass  DISPOSITION OF SPECIMEN:  PATHOLOGY  COUNTS:  YES  TOURNIQUET:  * No tourniquets in log *  DICTATION: .Other Dictation: Dictation Number [id number]  PLAN OF CARE: Admit for overnight observation  PATIENT DISPOSITION:  PACU - hemodynamically stable.   Delay start of Pharmacological VTE agent (>24hrs) due to surgical blood loss or risk of bleeding: yes

## 2016-05-22 NOTE — Anesthesia Procedure Notes (Signed)
Procedure Name: Intubation Date/Time: 05/22/2016 11:07 AM Performed by: Mariea Clonts Pre-anesthesia Checklist: Patient identified, Emergency Drugs available, Suction available and Patient being monitored Patient Re-evaluated:Patient Re-evaluated prior to inductionOxygen Delivery Method: Circle System Utilized Preoxygenation: Pre-oxygenation with 100% oxygen Intubation Type: IV induction Ventilation: Mask ventilation without difficulty Laryngoscope Size: Mac and 4 Grade View: Grade I Tube type: Oral Number of attempts: 1 Airway Equipment and Method: Stylet and Oral airway Placement Confirmation: ETT inserted through vocal cords under direct vision,  positive ETCO2 and breath sounds checked- equal and bilateral Secured at: 22 cm Tube secured with: Tape Dental Injury: Teeth and Oropharynx as per pre-operative assessment

## 2016-05-22 NOTE — Transfer of Care (Signed)
Immediate Anesthesia Transfer of Care Note  Patient: Randy Blackwell  Procedure(s) Performed: Procedure(s) with comments: DIRECT LARYNGOSCOPY (N/A) - direct laryngoscopy with biopsy ESOPHAGOSCOPY (N/A)  Patient Location: PACU  Anesthesia Type:General  Level of Consciousness: awake, alert  and patient cooperative  Airway & Oxygen Therapy: Patient Spontanous Breathing and Patient connected to nasal cannula oxygen  Post-op Assessment: Report given to RN and Post -op Vital signs reviewed and stable  Post vital signs: Reviewed and stable  Last Vitals:  Filed Vitals:   05/22/16 0747  BP: 161/99  Pulse: 68  Temp: 36.4 C  Resp: 18    Last Pain: There were no vitals filed for this visit.    Patients Stated Pain Goal: 3 ([id number])  Complications: No apparent anesthesia complications

## 2016-05-22 NOTE — H&P (Signed)
Agnes Probert is an 65 y.o. male.   Chief Complaint: pharyngeal mass, dysphagia, weight loss HPI: 65 year old male who developed bloody cough a few months ago and dysphagia one month ago.  He underwent upper endoscopy by GI earlier this month and a pharyngeal mass was identified.  He has lost a lot of weight due to severe dysphagia.  He presents for surgical biopsy.  Past Medical History  Diagnosis Date  . Shortness of breath dyspnea   . GERD (gastroesophageal reflux disease)   . Seizures ([place])     per PCP notes - pt states he's not had a seizure for over a year or more (as of 05/21/16.  [name] Hypertension     per PCP notes - pt states he's never been prescribed medication for this    Past Surgical History  Procedure Laterality Date  . None    . No past surgeries    . Colonoscopy with propofol N/A 05/05/2016    Procedure: COLONOSCOPY WITH PROPOFOL;  Surgeon: Danie Binder, MD;  [facility];  Service: Endoscopy;  Laterality: N/A;  0730  . Esophagogastroduodenoscopy (egd) with propofol N/A 05/05/2016    Procedure: ESOPHAGOGASTRODUODENOSCOPY (EGD) WITH PROPOFOL;  Surgeon: Danie Binder, MD;  [facility];  Service: Endoscopy;  Laterality: N/A;  . Polypectomy  05/05/2016    Procedure: POLYPECTOMY;  Surgeon: Danie Binder, MD;  [facility];  Service: Endoscopy;;  cecal polyp cold bx,    Family History  Problem Relation Age of Onset  . Colon cancer Neg Hx   . Pancreatic cancer Neg Hx   . Stomach cancer Neg Hx   . Diabetes Mother    Social History:  reports that he has been smoking Cigarettes.  He has been smoking about 1.00 pack per day. He has never used smokeless tobacco. He reports that he drinks alcohol. He reports that he uses illicit drugs (Marijuana).  Allergies: No Known Allergies  Medications Prior to Admission  Medication Sig Dispense Refill  . ENSURE (ENSURE) Take 237 mLs by mouth 3 (three) times daily between meals.      Results for orders  placed or performed during the hospital encounter of 05/22/16 (from the past 48 hour(s))  CBC     Status: None   Collection Time: 05/22/16  7:41 AM  Result Value Ref Range   WBC 7.7 4.0 - 10.5 K/uL   RBC 4.97 4.22 - 5.81 MIL/uL   Hemoglobin 15.6 13.0 - 17.0 g/dL   HCT 45.9 39.0 - 52.0 %   MCV 92.4 78.0 - 100.0 fL   MCH 31.4 26.0 - 34.0 pg   MCHC 34.0 30.0 - 36.0 g/dL   RDW 13.5 11.5 - 15.5 %   Platelets 360 150 - 400 K/uL  Basic metabolic panel     Status: Abnormal   Collection Time: 05/22/16  7:41 AM  Result Value Ref Range   Sodium 136 135 - 145 mmol/L   Potassium 4.3 3.5 - 5.1 mmol/L   Chloride 96 (L) 101 - 111 mmol/L   CO2 26 22 - 32 mmol/L   Glucose, Bld 126 (H) 65 - 99 mg/dL   BUN 25 (H) 6 - 20 mg/dL   Creatinine, Ser 0.97 0.61 - 1.24 mg/dL   Calcium 10.0 8.9 - 10.3 mg/dL   GFR calc non Af Amer >60 >60 mL/min   GFR calc Af Amer >60 >60 mL/min    Comment: (NOTE) The eGFR has been calculated  using the CKD EPI equation. This calculation has not been validated in all clinical situations. eGFR's persistently <60 mL/min signify possible Chronic Kidney Disease.    Anion gap 14 5 - 15  Protime-INR     Status: None   Collection Time: 05/22/16  9:16 AM  Result Value Ref Range   Prothrombin Time 15.0 11.6 - 15.2 seconds   INR 1.16 0.00 - 1.49   No results found.  Review of Systems  Constitutional: Positive for weight loss.  HENT: Positive for sore throat.   All other systems reviewed and are negative.   Blood pressure 161/99, pulse 68, temperature 97.6 F (36.4 C), temperature source Oral, resp. rate 18, height _0  (1.803 m), weight 47.628 kg (105 lb), SpO2 98 %. Physical Exam  Constitutional: He is oriented to person, place, and time. He appears well-developed. No distress.  Cachectic.  HENT:  Head: Normocephalic and atraumatic.  Right Ear: External ear normal.  Left Ear: External ear normal.  Nose: Nose normal.  Mouth/Throat: Oropharynx is clear and moist.   Eyes: Conjunctivae and EOM are normal. Pupils are equal, round, and reactive to light.  Neck: Normal range of motion. Neck supple.  Cardiovascular: Normal rate.   Respiratory: Effort normal.  Musculoskeletal: Normal range of motion.  Neurological: He is alert and oriented to person, place, and time. No cranial nerve deficit.  Skin: Skin is warm and dry.  Psychiatric: He has a normal mood and affect. His behavior is normal. Judgment and thought content normal.     Assessment/Plan Hypopharyngeal carcinoma, dysphagia, malnutrition To OR for direct laryngoscopy with biopsy, esophagoscopy. Later today, IR will perform a push-through gastrostomy tube for nutrition. Plan overnight observation.  Will get neck and chest CT while here.  Will consult nutrition and social work.  Melida Quitter, MD 05/22/2016, 10:58 AM

## 2016-05-22 NOTE — Sedation Documentation (Signed)
Patient denies pain and is resting comfortably.  

## 2016-05-22 NOTE — Progress Notes (Addendum)
Initial Nutrition Assessment  DOCUMENTATION CODES:   Underweight  INTERVENTION:   -Initiate Jevity 1.2 @ 25 ml/hr via PEG and increase by 10 ml every 12 hours to goal rate of 55 ml/hr.   Tube feeding regimen provides 1584 kcal (100% of needs), 73 grams of protein, and 1065 ml of H2O.   -Recommend check Mg, K, and Phos daily x 3 days, due to high risk of refeeding syndrome  -If pt transitions to bolus feedings, recommend:  Initiate bolus feeds of 350 ml of Jevity 1.2 via PEG  4 times daily  Recommend 30 ml free water flush before and after each feeding administration  Tube feeding regimen provides 1680 kcal (>100% of needs), 78 grams of protein, and 1130 ml of H2O (1370 ml total fluid with inclusion of free water flush regimen).    NUTRITION DIAGNOSIS:   Inadequate oral intake related to inability to eat as evidenced by NPO status.  GOAL:   Patient will meet greater than or equal to 90% of their needs  MONITOR:   Labs, Weight trends, TF tolerance, I & O's  REASON FOR ASSESSMENT:   Consult, Malnutrition Screening Tool Enteral/tube feeding initiation and management  ASSESSMENT:   65 year old male who developed bloody cough a few months ago and dysphagia one month ago. He underwent upper endoscopy by GI earlier this month and a pharyngeal mass was identified. He has lost a lot of weight due to severe dysphagia. He presents for surgical biopsy.  Pt admitted with pharyngeal mass and dysphagia.   S/p Procedure(s) with comments on 5/26/7: DIRECT LARYNGOSCOPY (N/A) - direct laryngoscopy with biopsy ESOPHAGOSCOPY (N/A)  Pt also to have PEG placed by radiology today.   Pt down for procedures at time of visit. Unable to complete Nutrition-Focused physical exam at this time.   Hx obtained from pt's brother. Pt brother reports that pt has experienced a general decline in health over the past month. Pt has undergone extensive work-up PTA by GI, including upper endoscopy and  colonoscopy. Pt has experienced weight loss as a result of dysphagia to solids. Pt brother confirms that pt has been consuming solely full liquids over the past month; pt consumes mainly 4 Ensure supplements daily and 2 Carnation Instant Breakfast shakes daily, along with a minimal amount of ice cream. Pt can only take very small sips at at time a result of dysphagia and takes shakes in very slowly throughout the day. Supplement regimen would provide approximately 1360 kcals and 56 grams of protein, however, suspect intake is over-reported due to amount of volume pt would need to consume.   Pt brother reports that pt has always been small-framed like himself ("the most he ever weighed was 140-150# in wet clothes"). He estimates pt has lost about 25# within the past year and 10-15# within the past month. Per wt hx, pt has experienced a 19.2% wt loss over the past month.   RD suspects malnutrition, however, unable to confirm at this time.   Case discussed with RN, who reports pt is very frail and emaciated-looking. Plan to initiate TF tomorrow.   Labs reviewed: Glucose: 126.   Diet Order:  Diet NPO time specified Except for: Ice Chips  Skin:  Reviewed, no issues  Last BM:  PTA  Height:   Ht Readings from Last 1 Encounters:  05/22/16 5\' 11"  (1.803 m)    Weight:   Wt Readings from Last 1 Encounters:  05/22/16 105 lb (47.628 kg)    Ideal  Body Weight:  78.2 kg  BMI:  Body mass index is 14.65 kg/(m^2).  Estimated Nutritional Needs:   Kcal:  1450-1650  Protein:  70-85 grams  Fluid:  1.4-1.6 L  EDUCATION NEEDS:   No education needs identified at this time  Jahlani Lorentz A. Jimmye Norman, RD, LDN, CDE Pager: [phone] After hours Pager: [phone]

## 2016-05-22 NOTE — H&P (Signed)
Chief Complaint: throat mass  Referring Physician:Dr. Melida Quitter  Supervising Physician: Arne Cleveland  Patient Status:  Out-pt  HPI: Randy Blackwell is an 65 y.o. male with a history of ETOH use, seizures, who recently began struggling with dysphagia.  He states it has been at least over a month.  He has had tremendous weight loss as well.  He was referred to a GI doc who did a colonoscopy and endoscopy.  The endoscopy revealed a large mass in the left pyriform sinus.  He was then referred to ENT for which he is set up for a biopsy today.  The assumption is this is likely a malignancy and a request for a g-tube has been made at the same time.  The patient denies any abdominal pain or surgery to his abdomen.  He admits to eating mostly liquids as he is unable to tolerate solid foods.  He has no other complaints such as fevers, chills, HA, CP, or SOB.  Past Medical History:  Past Medical History  Diagnosis Date  . Shortness of breath dyspnea   . GERD (gastroesophageal reflux disease)   . Seizures ([place])     per PCP notes - pt states he's not had a seizure for over a year or more (as of 05/21/16.  [name] Hypertension     per PCP notes - pt states he's never been prescribed medication for this    Past Surgical History:  Past Surgical History  Procedure Laterality Date  . None    . No past surgeries    . Colonoscopy with propofol N/A 05/05/2016    Procedure: COLONOSCOPY WITH PROPOFOL;  Surgeon: Danie Binder, MD;  [facility];  Service: Endoscopy;  Laterality: N/A;  0730  . Esophagogastroduodenoscopy (egd) with propofol N/A 05/05/2016    Procedure: ESOPHAGOGASTRODUODENOSCOPY (EGD) WITH PROPOFOL;  Surgeon: Danie Binder, MD;  [facility];  Service: Endoscopy;  Laterality: N/A;  . Polypectomy  05/05/2016    Procedure: POLYPECTOMY;  Surgeon: Danie Binder, MD;  [facility];  Service: Endoscopy;;  cecal polyp cold bx,    Family History:  Family History    Problem Relation Age of Onset  . Colon cancer Neg Hx   . Pancreatic cancer Neg Hx   . Stomach cancer Neg Hx   . Diabetes Mother     Social History:  reports that he has been smoking Cigarettes.  He has been smoking about 1.00 pack per day. He has never used smokeless tobacco. He reports that he drinks alcohol. He reports that he uses illicit drugs (Marijuana).  Allergies: No Known Allergies  Medications:   Medication List    ASK your doctor about these medications        ENSURE  Take 237 mLs by mouth 3 (three) times daily between meals.        Please HPI for pertinent positives, otherwise complete 10 system ROS negative.  Mallampati Score: MD Evaluation Airway: WNL Heart: WNL Abdomen: WNL Chest/ Lungs: WNL ASA  Classification: 3 Mallampati/Airway Score: One  Physical Exam: BP 161/99 mmHg  Pulse 68  Temp(Src) 97.6 F (36.4 C) (Oral)  Resp 18  Ht 5' 11"  (1.803 m)  Wt 105 lb (47.628 kg)  BMI 14.65 kg/m2  SpO2 98% Body mass index is 14.65 kg/(m^2). General: frail, cachetic black male who is laying in bed in NAD HEENT: head is normocephalic, atraumatic.  Sclera are noninjected.  PERRL.  Ears and nose without any  masses or lesions.  Mouth is pink  Heart: regular, rate, and rhythm.  Normal s1,s2. No obvious murmurs, gallops, or rubs noted.  Palpable radial and pedal pulses bilaterally Lungs: CTAB, no wheezes, rhonchi, or rales noted.  Respiratory effort nonlabored Abd: soft, NT, ND, +BS, no masses, hernias, or organomegaly MS: all 4 extremities are symmetrical with no cyanosis, clubbing, or edema. Psych: A&Ox3 with an appropriate affect.   Labs: Results for orders placed or performed during the hospital encounter of 05/22/16 (from the past 48 hour(s))  CBC     Status: None   Collection Time: 05/22/16  7:41 AM  Result Value Ref Range   WBC 7.7 4.0 - 10.5 K/uL   RBC 4.97 4.22 - 5.81 MIL/uL   Hemoglobin 15.6 13.0 - 17.0 g/dL   HCT 45.9 39.0 - 52.0 %   MCV 92.4  78.0 - 100.0 fL   MCH 31.4 26.0 - 34.0 pg   MCHC 34.0 30.0 - 36.0 g/dL   RDW 13.5 11.5 - 15.5 %   Platelets 360 150 - 400 K/uL  Basic metabolic panel     Status: Abnormal   Collection Time: 05/22/16  7:41 AM  Result Value Ref Range   Sodium 136 135 - 145 mmol/L   Potassium 4.3 3.5 - 5.1 mmol/L   Chloride 96 (L) 101 - 111 mmol/L   CO2 26 22 - 32 mmol/L   Glucose, Bld 126 (H) 65 - 99 mg/dL   BUN 25 (H) 6 - 20 mg/dL   Creatinine, Ser 0.97 0.61 - 1.24 mg/dL   Calcium 10.0 8.9 - 10.3 mg/dL   GFR calc non Af Amer >60 >60 mL/min   GFR calc Af Amer >60 >60 mL/min    Comment: (NOTE) The eGFR has been calculated using the CKD EPI equation. This calculation has not been validated in all clinical situations. eGFR's persistently <60 mL/min signify possible Chronic Kidney Disease.    Anion gap 14 5 - 15    Imaging: No results found.  Assessment/Plan 1. Left pyriform sinus mass, dysphagia -the patient is getting a biopsy today by ENT.  We have been asked to place a push g-tube after his biopsy procedure while he is still here.  This will help with his nutrition, but also help pending what further treatment he may need to have. -PT/INR is pending -will give 1g of Ancef on call to radiology  -he will need to remain NPO p his biopsy for our procedure -Risks and Benefits discussed with the patient including, but not limited to the need for a barium enema during the procedure, bleeding, infection, peritonitis, or damage to adjacent structures. All of the patient's questions were answered, patient is agreeable to proceed. Consent signed and in chart.   Thank you for this interesting consult.  I greatly enjoyed meeting Randy Blackwell and look forward to participating in their care.  A copy of this report was sent to the requesting provider on this date.  Electronically Signed: Henreitta Cea 05/22/2016, 9:20 AM   I spent a total of  30 Minutes  in face to face in clinical consultation,  greater than 50% of which was counseling/coordinating care for dysphagia, left pyriform sinus mass

## 2016-05-22 NOTE — Anesthesia Preprocedure Evaluation (Addendum)
Anesthesia Evaluation  Patient identified by MRN, date of birth, ID band Patient awake    Reviewed: Allergy & Precautions, NPO status , Patient's Chart, lab work & pertinent test results  Airway Mallampati: I  TM Distance: >3 FB Neck ROM: Full    Dental  (+) Dental Advisory Given   Pulmonary Current Smoker,  Pharyngeal cancer   breath sounds clear to auscultation       Cardiovascular hypertension,  Rhythm:Regular Rate:Normal     Neuro/Psych Seizures -, Well Controlled,     GI/Hepatic Neg liver ROS, GERD  ,  Endo/Other  negative endocrine ROS  Renal/GU negative Renal ROS     Musculoskeletal   Abdominal   Peds  Hematology negative hematology ROS (+)   Anesthesia Other Findings   Reproductive/Obstetrics                            Lab Results  Component Value Date   WBC 7.7 05/22/2016   HGB 15.6 05/22/2016   HCT 45.9 05/22/2016   MCV 92.4 05/22/2016   PLT 360 05/22/2016   Lab Results  Component Value Date   CREATININE 0.70 04/23/2016   BUN 6 04/23/2016   NA 141 04/23/2016   K 3.8 04/23/2016   CL 103 04/23/2016   CO2 30 04/23/2016    Anesthesia Physical Anesthesia Plan  ASA: II  Anesthesia Plan: General   Post-op Pain Management:    Induction: Intravenous  Airway Management Planned: Mask  Additional Equipment:   Intra-op Plan:   Post-operative Plan:   Informed Consent: I have reviewed the patients History and Physical, chart, labs and discussed the procedure including the risks, benefits and alternatives for the proposed anesthesia with the patient or authorized representative who has indicated his/her understanding and acceptance.   Dental advisory given  Plan Discussed with: CRNA  Anesthesia Plan Comments:         Anesthesia Quick Evaluation

## 2016-05-23 DIAGNOSIS — C140 Malignant neoplasm of pharynx, unspecified: Secondary | ICD-10-CM | POA: Diagnosis not present

## 2016-05-23 LAB — GLUCOSE, CAPILLARY: Glucose-Capillary: 159 mg/dL — ABNORMAL HIGH (ref 65–99)

## 2016-05-23 NOTE — Discharge Summary (Signed)
Physician Discharge Summary  Patient ID: Randy Blackwell MRN: [id number] DOB/AGE: 63-Mar-1952 63 y.o.  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 He received PEG tube by radiology and instructions on how to use. He has no complaints or problems with throat. He has no pain. No bleeding or airway problem. He is ready to go home. Nursing to be sure he is instructed for PEG care. He will follow up in 1 week  Consults: radiology  Significant Diagnostic Studies: PEG  Treatments:   Discharge Exam: Blood pressure 132/74, pulse 61, temperature 97.9 F (36.6 C), temperature source Oral, resp. rate 18, height 5\' 11"  (1.803 m), weight 47.7 kg (105 lb 2.6 oz), SpO2 100 %. awake and alert. he is talking normal and no breathing problem. neck without swelling. cv- rrr l- clear ext- no swelling or tenderness abd- bandage in place  Disposition: 01-Home or Self Care  Discharge Instructions    Call MD for:  difficulty breathing, headache or visual disturbances    Complete by:  As directed      Call MD for:  extreme fatigue    Complete by:  As directed      Call MD for:  hives    Complete by:  As directed      Call MD for:  persistant dizziness or light-headedness    Complete by:  As directed      Call MD for:  persistant nausea and vomiting    Complete by:  As directed      Call MD for:  redness, tenderness, or signs of infection (pain, swelling, redness, odor or green/yellow discharge around incision site)    Complete by:  As directed      Call MD for:  severe uncontrolled pain    Complete by:  As directed      Call MD for:  temperature >100.4    Complete by:  As directed      Diet - low sodium heart healthy    Complete by:  As directed      Discharge instructions    Complete by:  As directed   Call if any questions or problems. Follow up with Dr Redmond Baseman next week. Radiology should have given you instructions for the stomach tube.     Increase activity slowly    Complete by:  As directed             Medication List    TAKE these medications        ENSURE  Take 237 mLs by mouth 3 (three) times daily between meals.         SignedMelissa Montane 05/23/2016, 8:09 AM

## 2016-05-23 NOTE — Progress Notes (Signed)
Per Dr. Redmond Baseman patient is ready for discharge, patient need to be cleared by IR to be discharge as well. Per Dr. Redmond Baseman peg tube will be used later during patients radiation therapy so patient can still drink . IR paged and stated that patient is cleared to go home per Dr. Sandria Manly McCoullough. Patient was educated on how to use peg tube. Patient verbally stated the process on how to use peg . Patient aware that the peg will be use when he goes to his radiation treatment.

## 2016-05-23 NOTE — Progress Notes (Signed)
Discharge home. Home discharge instruction given, no question verbalized. 

## 2016-05-25 ENCOUNTER — Encounter (HOSPITAL_COMMUNITY): Payer: Self-pay | Admitting: Otolaryngology

## 2016-05-25 NOTE — Op Note (Signed)
NAME:  Randy Blackwell, Randy Blackwell                  ACCOUNT NO.:  MEDICAL RECORD NO.:  [id number]  LOCATION:                                 FACILITY:  PHYSICIAN:  Onnie Graham, MD     DATE OF BIRTH:  [birth year]  DATE OF PROCEDURE:  05/22/2016 DATE OF DISCHARGE:                              OPERATIVE REPORT   PREOPERATIVE DIAGNOSES: 1. Posterior pharyngeal wall mass. 2. Dysphagia. 3. Malnutrition.  POSTOPERATIVE DIAGNOSES: 1. Posterior pharyngeal wall mass. 2. Dysphagia. 3. Malnutrition.  PROCEDURE: 1. Direct laryngoscopy with biopsy. 2. Esophagoscopy.  SURGEON:  Onnie Graham, MD  ANESTHESIA:  General endotracheal anesthesia.  COMPLICATIONS:  None.  INDICATION:  The patient is a 65 year old male who started with bloody cough a few months ago and then developed dysphagia about a month ago. He had an upper endoscopy performed earlier this month that demonstrated a pharyngeal wall mass and he presents today for surgical biopsy.  FINDINGS:  There is a friable ulcerative mass involving the posterior pharyngeal wall and right piriform sinus.  The mass extends superiorly to the inferior border of the tonsil along the lateral pharyngeal wall and involves the entirety of the right piriform sinus and extends across the posterior pharyngeal wall past the midline, but the left piriform sinus remains free of tumor, at least just lateral and anterior portions.  The tumor extends inferiorly to the esophageal inlet along the posterior wall, but is not seen in the esophagus beyond that.  The endolarynx is free of tumor as is the epiglottis.  The tongue base is normal.  DESCRIPTION OF PROCEDURE:  The patient was identified in the holding room and informed consent having been obtained including discussion of risks, benefits, alternatives, the patient was brought to the operative suite and put on the operative table in supine position.  Anesthesia was induced.  The patient was intubated by  anesthesia team without difficulty.  The eyes were taped closed and the bed was turned 90 degrees from anesthesia.  Damp gauze was placed over the upper gum and a Storz laryngoscope was then inserted to view the pharynx and larynx. Findings were noted above.  A couple of biopsies were taken from the midline posterior pharyngeal wall using straight cup forceps.  These were placed in formalin for Pathology.  After full evaluation of the pharynx and larynx, the laryngoscope was removed and a cervical esophagoscope was then placed and passed down the left side of the pharynx along the left piriform sinus and into the esophageal inlet. The scope was passed down to its full extent and then slowly backed out, evaluating the esophagus.  After completion, the esophagoscope was removed.  Once again, laryngoscope was replaced and used to suction the pharynx of blood.  The laryngoscope was then removed and the gauze removed as well.  He was returned back to Anesthesia for wake-up and was extubated, and moved to recovery room in stable condition.     Onnie Graham, MD     DDB/MEDQ  D:  05/22/2016  T:  05/23/2016  Job:  [id number]

## 2016-05-27 NOTE — Anesthesia Postprocedure Evaluation (Signed)
Anesthesia Post Note  Patient: Randy Blackwell  Procedure(s) Performed: Procedure(s) (LRB): DIRECT LARYNGOSCOPY (N/A) ESOPHAGOSCOPY (N/A)  Patient location during evaluation: PACU Anesthesia Type: General Level of consciousness: awake and alert Pain management: pain level controlled Vital Signs Assessment: post-procedure vital signs reviewed and stable Respiratory status: spontaneous breathing, nonlabored ventilation, respiratory function stable and patient connected to nasal cannula oxygen Cardiovascular status: blood pressure returned to baseline and stable Postop Assessment: no signs of nausea or vomiting Anesthetic complications: no    Last Vitals:  Filed Vitals:   05/23/16 0155 05/23/16 0610  BP: 128/72 132/74  Pulse: 62 61  Temp: 36.7 C 36.6 C  Resp: 18 18    Last Pain:  Filed Vitals:   05/23/16 0612  PainSc: Tyler Deis

## 2016-05-29 ENCOUNTER — Emergency Department (HOSPITAL_COMMUNITY)
Admission: EM | Admit: 2016-05-29 | Discharge: 2016-05-29 | Disposition: A | Discharge status: Home / Self Care | Payer: Medicaid Other | Attending: Emergency Medicine | Admitting: Emergency Medicine

## 2016-05-29 ENCOUNTER — Encounter (HOSPITAL_COMMUNITY): Payer: Self-pay | Admitting: *Deleted

## 2016-05-29 DIAGNOSIS — F17210 Nicotine dependence, cigarettes, uncomplicated: Secondary | ICD-10-CM | POA: Diagnosis not present

## 2016-05-29 DIAGNOSIS — K6289 Other specified diseases of anus and rectum: Secondary | ICD-10-CM | POA: Diagnosis not present

## 2016-05-29 DIAGNOSIS — I10 Essential (primary) hypertension: Secondary | ICD-10-CM | POA: Diagnosis not present

## 2016-05-29 DIAGNOSIS — K5900 Constipation, unspecified: Secondary | ICD-10-CM | POA: Diagnosis not present

## 2016-05-29 DIAGNOSIS — K5641 Fecal impaction: Secondary | ICD-10-CM

## 2016-05-29 HISTORY — DX: Malignant (primary) neoplasm, unspecified: C80.1

## 2016-05-29 MED ORDER — FLEET ENEMA 7-19 GM/118ML RE ENEM
1.0000 | ENEMA | Freq: Once | RECTAL | Status: AC
  Administered 2016-05-29: 1 via RECTAL

## 2016-05-29 MED ORDER — MAGNESIUM CITRATE PO SOLN
1.0000 | Freq: Once | ORAL | Status: AC
  Administered 2016-05-29: 1
  Filled 2016-05-29: qty 296

## 2016-05-29 NOTE — ED Notes (Signed)
Pt comes in for constipation. Pt states he hasn't made a bowel movement in 4 days. Pt has a feeding tube in place and states that this is working properly. Denies any vomiting.

## 2016-05-29 NOTE — ED Notes (Signed)
PAtient sitting on bedside commode with call bell in reach. Advised patient to press nurse button and writer will assist him back in bed. Verbalizes understanding.

## 2016-05-29 NOTE — Discharge Instructions (Signed)
After Each container of ensure, give 8-16 oz of water (2 more empty ensure containers filled with water) through g tube.   Constipation, Adult Constipation is when a person has fewer than three bowel movements a week, has difficulty having a bowel movement, or has stools that are dry, hard, or larger than normal. As people grow older, constipation is more common. A low-fiber diet, not taking in enough fluids, and taking certain medicines may make constipation worse.  CAUSES   Certain medicines, such as antidepressants, pain medicine, iron supplements, antacids, and water pills.   Certain diseases, such as diabetes, irritable bowel syndrome (IBS), thyroid disease, or depression.   Not drinking enough water.   Not eating enough fiber-rich foods.   Stress or travel.   Lack of physical activity or exercise.   Ignoring the urge to have a bowel movement.   Using laxatives too much.  SIGNS AND SYMPTOMS   Having fewer than three bowel movements a week.   Straining to have a bowel movement.   Having stools that are hard, dry, or larger than normal.   Feeling full or bloated.   Pain in the lower abdomen.   Not feeling relief after having a bowel movement.  DIAGNOSIS  Your health care provider will take a medical history and perform a physical exam. Further testing may be done for severe constipation. Some tests may include:  A barium enema X-ray to examine your rectum, colon, and, sometimes, your small intestine.   A sigmoidoscopy to examine your lower colon.   A colonoscopy to examine your entire colon. TREATMENT  Treatment will depend on the severity of your constipation and what is causing it. Some dietary treatments include drinking more fluids and eating more fiber-rich foods. Lifestyle treatments may include regular exercise. If these diet and lifestyle recommendations do not help, your health care provider may recommend taking over-the-counter laxative  medicines to help you have bowel movements. Prescription medicines may be prescribed if over-the-counter medicines do not work.  HOME CARE INSTRUCTIONS   Eat foods that have a lot of fiber, such as fruits, vegetables, whole grains, and beans.  Limit foods high in fat and processed sugars, such as french fries, hamburgers, cookies, candies, and soda.   A fiber supplement may be added to your diet if you cannot get enough fiber from foods.   Drink enough fluids to keep your urine clear or pale yellow.   Exercise regularly or as directed by your health care provider.   Go to the restroom when you have the urge to go. Do not hold it.   Only take over-the-counter or prescription medicines as directed by your health care provider. Do not take other medicines for constipation without talking to your health care provider first.  [place] IF:   You have bright red blood in your stool.   Your constipation lasts for more than 4 days or gets worse.   You have abdominal or rectal pain.   You have thin, pencil-like stools.   You have unexplained weight loss. MAKE SURE YOU:   Understand these instructions.  Will watch your condition.  Will get help right away if you are not doing well or get worse.   This information is not intended to replace advice given to you by your health care provider. Make sure you discuss any questions you have with your health care provider.   Document Released: 09/11/2004 Document Revised: 01/04/2015 Document Reviewed: 09/25/2013 Elsevier Interactive Patient Education 2016  [employer] ° °

## 2016-05-29 NOTE — ED Provider Notes (Signed)
CSN: [id number]     Arrival date & time 05/29/16  1248 History   First MD Initiated Contact with Patient 05/29/16 1556     Chief Complaint  Patient presents with  . Constipation      HPI  Patient presents for evaluation of constipation. He has a history of a known laryngeal cancer. She developed marked dysphagia. He is still awaiting plan a procedure of radiation and surgical excision. He has a G-tube that was placed one month ago. He hasn't had a bowel movement in 4 days. He is taking ensure he states anywhere from 3-4 per day. Is not taking any supplemental water. He is still making urine. He has no abdominal pain. He has some perirectal pain and feels like there is some stool there that simply will not pass.  Past Medical History  Diagnosis Date  . Shortness of breath dyspnea   . GERD (gastroesophageal reflux disease)   . Seizures ([place])     per PCP notes - pt states he's not had a seizure for over a year or more (as of 05/21/16.  [name] Hypertension     per PCP notes - pt states he's never been prescribed medication for this  . 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 None    . Colonoscopy with propofol N/A 05/05/2016    Procedure: COLONOSCOPY WITH PROPOFOL;  Surgeon: Danie Binder, MD;  Location: [facility];  Service: Endoscopy;  Laterality: N/A;  0730  . Esophagogastroduodenoscopy (egd) with propofol N/A 05/05/2016    Procedure: ESOPHAGOGASTRODUODENOSCOPY (EGD) WITH PROPOFOL;  Surgeon: Danie Binder, MD;  Location: [facility];  Service: Endoscopy;  Laterality: N/A;  . Polypectomy  05/05/2016    Procedure: POLYPECTOMY;  Surgeon: Danie Binder, MD;  Location: [facility];  Service: Endoscopy;;  cecal polyp cold bx,  . Direct laryngoscopy  05/22/2016  . Direct laryngoscopy N/A 05/22/2016    Procedure: DIRECT LARYNGOSCOPY;  Surgeon: Melida Quitter, MD;  Location: [facility];  Service: ENT;  Laterality: N/A;  direct laryngoscopy with biopsy  .  Esophagoscopy N/A 05/22/2016    Procedure: ESOPHAGOSCOPY;  Surgeon: Melida Quitter, MD;  [facility];  Service: ENT;  Laterality: N/A;   Family History  Problem Relation Age of Onset  . Colon cancer Neg Hx   . Pancreatic cancer Neg Hx   . Stomach cancer Neg Hx   . Diabetes Mother    Social History  Substance Use Topics  . Smoking status: Current Every Day Smoker -- 1.00 packs/day for 50 years    Types: Cigarettes  . Smokeless tobacco: Never Used  . Alcohol Use: 0.0 oz/week    0 Standard drinks or equivalent per week     Comment: wine and beer. Drinks about a 6 pack of beer a day. About a 1/5 or more of wine a day.     Review of Systems  Constitutional: Negative for fever, chills, diaphoresis, appetite change and fatigue.  HENT: Negative for mouth sores, sore throat and trouble swallowing.   Eyes: Negative for visual disturbance.  Respiratory: Negative for cough, chest tightness, shortness of breath and wheezing.   Cardiovascular: Negative for chest pain.  Gastrointestinal: Positive for constipation and rectal pain. Negative for nausea, vomiting, abdominal pain, diarrhea and abdominal distention.  Endocrine: Negative for polydipsia, polyphagia and polyuria.  Genitourinary: Negative for dysuria, frequency and hematuria.  Musculoskeletal: Negative for gait problem.  Skin: Negative for  color change, pallor and rash.  Neurological: Negative for dizziness, syncope, light-headedness and headaches.  Hematological: Does not bruise/bleed easily.  Psychiatric/Behavioral: Negative for behavioral problems and confusion.      Allergies  Review of patient's allergies indicates no known allergies.  Home Medications   Prior to Admission medications   Medication Sig Start Date End Date Taking? Authorizing Provider  ENSURE (ENSURE) Take 237 mLs by mouth 3 (three) times daily between meals.    Historical Provider, MD   BP 132/78 mmHg  Pulse 65  Temp(Src) 97.9 F (36.6 C) (Oral)  Resp  17  Ht 5\' 9"  (1.753 m)  Wt 105 lb (47.628 kg)  BMI 15.50 kg/m2  SpO2 100% Physical Exam  Constitutional: He is oriented to person, place, and time. He appears well-developed and well-nourished. No distress.  HENT:  Head: Normocephalic.  Eyes: Conjunctivae are normal. Pupils are equal, round, and reactive to light. No scleral icterus.  Neck: Normal range of motion. Neck supple. No thyromegaly present.  Cardiovascular: Normal rate and regular rhythm.  Exam reveals no gallop and no friction rub.   No murmur heard. Pulmonary/Chest: Effort normal and breath sounds normal. No respiratory distress. He has no wheezes. He has no rales.  Abdominal: Soft. Bowel sounds are normal. He exhibits no distension. There is no tenderness. There is no rebound.  No obvious perirectal disease, hemorrhoids, or abscess. There is a large volume of quite hard stool proximal on digital rectal exam.  Musculoskeletal: Normal range of motion.  Neurological: He is alert and oriented to person, place, and time.  Skin: Skin is warm and dry. No rash noted.  Psychiatric: He has a normal mood and affect. His behavior is normal.    ED Course  Procedures (including critical care time) Labs Review Labs Reviewed - No data to display  Imaging Review No results found. I have personally reviewed and evaluated these images and lab results as part of my medical decision-making.   EKG Interpretation None      MDM   Final diagnoses:  None    Pt Given large volume soapsuds enema. After an hour he eliminated this. Unable to pass hard stool. On repeat rectal exam stool softener considerably. Is able to manually disimpact a large amount of stool until there was only soft stool in the vault. Was given an additional fleets enema. Was given magnesium citrate via his G-tube.  Plan is DC home, will use 1 or 2 of his ensure containers of water with each intake of ensure.    Tanna Furry, MD 05/29/16 2024

## 2016-06-03 ENCOUNTER — Ambulatory Visit (HOSPITAL_COMMUNITY)
Admission: RE | Admit: 2016-06-03 | Discharge: 2016-06-03 | Disposition: A | Discharge status: Home / Self Care | Payer: Medicaid Other | Source: Ambulatory Visit | Attending: Otolaryngology | Admitting: Otolaryngology

## 2016-06-03 DIAGNOSIS — C139 Malignant neoplasm of hypopharynx, unspecified: Secondary | ICD-10-CM | POA: Insufficient documentation

## 2016-06-03 DIAGNOSIS — J392 Other diseases of pharynx: Secondary | ICD-10-CM

## 2016-06-03 DIAGNOSIS — I700 Atherosclerosis of aorta: Secondary | ICD-10-CM | POA: Insufficient documentation

## 2016-06-03 DIAGNOSIS — J439 Emphysema, unspecified: Secondary | ICD-10-CM | POA: Insufficient documentation

## 2016-06-03 DIAGNOSIS — R938 Abnormal findings on diagnostic imaging of other specified body structures: Secondary | ICD-10-CM | POA: Insufficient documentation

## 2016-06-03 MED ORDER — IOPAMIDOL (ISOVUE-300) INJECTION 61%
75.0000 mL | Freq: Once | INTRAVENOUS | Status: AC | PRN
  Administered 2016-06-03: 75 mL via INTRAVENOUS

## 2016-07-17 ENCOUNTER — Inpatient Hospital Stay (HOSPITAL_COMMUNITY)
Admission: EM | Admit: 2016-07-17 | Discharge: 2016-07-18 | DRG: 204 | Disposition: A | Discharge status: Home / Self Care | Attending: Family Medicine | Admitting: Family Medicine

## 2016-07-17 ENCOUNTER — Emergency Department (HOSPITAL_COMMUNITY)

## 2016-07-17 ENCOUNTER — Encounter (HOSPITAL_COMMUNITY): Payer: Self-pay | Admitting: Emergency Medicine

## 2016-07-17 DIAGNOSIS — Z931 Gastrostomy status: Secondary | ICD-10-CM | POA: Diagnosis not present

## 2016-07-17 DIAGNOSIS — E876 Hypokalemia: Secondary | ICD-10-CM

## 2016-07-17 DIAGNOSIS — Z79899 Other long term (current) drug therapy: Secondary | ICD-10-CM

## 2016-07-17 DIAGNOSIS — J9501 Hemorrhage from tracheostomy stoma: Secondary | ICD-10-CM | POA: Diagnosis present

## 2016-07-17 DIAGNOSIS — D649 Anemia, unspecified: Secondary | ICD-10-CM

## 2016-07-17 DIAGNOSIS — K219 Gastro-esophageal reflux disease without esophagitis: Secondary | ICD-10-CM | POA: Diagnosis not present

## 2016-07-17 DIAGNOSIS — Z930 Tracheostomy status: Secondary | ICD-10-CM

## 2016-07-17 DIAGNOSIS — C139 Malignant neoplasm of hypopharynx, unspecified: Secondary | ICD-10-CM | POA: Diagnosis present

## 2016-07-17 DIAGNOSIS — F17210 Nicotine dependence, cigarettes, uncomplicated: Secondary | ICD-10-CM | POA: Diagnosis present

## 2016-07-17 DIAGNOSIS — E871 Hypo-osmolality and hyponatremia: Secondary | ICD-10-CM

## 2016-07-17 DIAGNOSIS — R042 Hemoptysis: Principal | ICD-10-CM

## 2016-07-17 HISTORY — DX: Malignant neoplasm of hypopharynx, unspecified: C13.9

## 2016-07-17 HISTORY — DX: Alcohol abuse, uncomplicated: F10.10

## 2016-07-17 HISTORY — DX: Bronchiectasis, uncomplicated: J47.9

## 2016-07-17 HISTORY — DX: Chronic obstructive pulmonary disease, unspecified: J44.9

## 2016-07-17 LAB — CBC WITH DIFFERENTIAL/PLATELET
BASOS ABS: 0 10*3/uL (ref 0.0–0.1)
Basophils Relative: 0 %
EOS PCT: 1 %
Eosinophils Absolute: 0.1 10*3/uL (ref 0.0–0.7)
HEMATOCRIT: 34.9 % — AB (ref 39.0–52.0)
Hemoglobin: 11.8 g/dL — ABNORMAL LOW (ref 13.0–17.0)
LYMPHS ABS: 1.2 10*3/uL (ref 0.7–4.0)
LYMPHS PCT: 13 %
MCH: 30.2 pg (ref 26.0–34.0)
MCHC: 33.8 g/dL (ref 30.0–36.0)
MCV: 89.3 fL (ref 78.0–100.0)
MONO ABS: 0.6 10*3/uL (ref 0.1–1.0)
MONOS PCT: 6 %
NEUTROS ABS: 7.4 10*3/uL (ref 1.7–7.7)
Neutrophils Relative %: 80 %
PLATELETS: 412 10*3/uL — AB (ref 150–400)
RBC: 3.91 MIL/uL — ABNORMAL LOW (ref 4.22–5.81)
RDW: 13.4 % (ref 11.5–15.5)
WBC: 9.3 10*3/uL (ref 4.0–10.5)

## 2016-07-17 LAB — BASIC METABOLIC PANEL
ANION GAP: 8 (ref 5–15)
BUN: 9 mg/dL (ref 6–20)
CALCIUM: 9.3 mg/dL (ref 8.9–10.3)
CO2: 34 mmol/L — AB (ref 22–32)
Chloride: 92 mmol/L — ABNORMAL LOW (ref 101–111)
Creatinine, Ser: 0.63 mg/dL (ref 0.61–1.24)
GFR calc Af Amer: >60 mL/min (ref >60)
GFR calc non Af Amer: >60 mL/min (ref >60)
GLUCOSE: 97 mg/dL (ref 65–99)
Potassium: 3.3 mmol/L — ABNORMAL LOW (ref 3.5–5.1)
Sodium: 134 mmol/L — ABNORMAL LOW (ref 135–145)

## 2016-07-17 LAB — TYPE AND SCREEN
ABO/RH(D): O POS
Antibody Screen: NEGATIVE

## 2016-07-17 LAB — PROTIME-INR
INR: 0.97 (ref 0.00–1.49)
Prothrombin Time: 13.1 seconds (ref 11.6–15.2)

## 2016-07-17 MED ORDER — MORPHINE SULFATE (CONCENTRATE) 10 MG/0.5ML PO SOLN: 5.0000 mg | ORAL | Status: DC | PRN

## 2016-07-17 MED ORDER — GUAIFENESIN 100 MG/5ML PO SYRP: 200.0000 mg | ORAL_SOLUTION | Freq: Three times a day (TID) | ORAL | Status: DC | PRN

## 2016-07-17 MED ORDER — FUROSEMIDE 20 MG PO TABS: 20.0000 mg | ORAL_TABLET | Freq: Every day | ORAL | Status: DC | PRN

## 2016-07-17 MED ORDER — ACETAMINOPHEN 650 MG RE SUPP: 650.0000 mg | Freq: Four times a day (QID) | RECTAL | Status: DC | PRN

## 2016-07-17 MED ORDER — SODIUM CHLORIDE 0.9 % IV SOLN: INTRAVENOUS | Status: DC

## 2016-07-17 MED ORDER — ACETAMINOPHEN 325 MG PO TABS: 650.0000 mg | ORAL_TABLET | Freq: Four times a day (QID) | ORAL | Status: DC | PRN

## 2016-07-17 MED ORDER — JEVITY 1.5 CAL/FIBER PO LIQD
1000.0000 mL | ORAL | Status: DC
  Administered 2016-07-18: 1000 mL
  Filled 2016-07-17 (×2): qty 1000

## 2016-07-17 MED ORDER — POTASSIUM CHLORIDE IN NACL 20-0.9 MEQ/L-% IV SOLN
INTRAVENOUS | Status: AC
  Administered 2016-07-18: via INTRAVENOUS
  Filled 2016-07-17 (×2): qty 1000

## 2016-07-17 MED ORDER — IOPAMIDOL (ISOVUE-300) INJECTION 61%
100.0000 mL | Freq: Once | INTRAVENOUS | Status: AC | PRN
  Administered 2016-07-17: 100 mL via INTRAVENOUS

## 2016-07-17 MED ORDER — SENNOSIDES 8.8 MG/5ML PO SYRP: 5.0000 mL | ORAL_SOLUTION | Freq: Every day | ORAL | Status: DC | PRN

## 2016-07-17 MED ORDER — LORAZEPAM 0.5 MG PO TABS: 0.5000 mg | ORAL_TABLET | ORAL | Status: DC | PRN

## 2016-07-17 MED ORDER — HYDROGEN PEROXIDE 3 % EX SOLN
CUTANEOUS | Status: AC
Start: 2016-07-17 — End: 2016-07-18
  Filled 2016-07-17: qty 473

## 2016-07-17 MED ORDER — SODIUM CHLORIDE 0.9% FLUSH
3.0000 mL | Freq: Two times a day (BID) | INTRAVENOUS | Status: DC
  Administered 2016-07-18: 3 mL via INTRAVENOUS

## 2016-07-17 NOTE — ED Notes (Signed)
Suctioned blood clots from oral airway. Respiratory called and trach care given per same

## 2016-07-17 NOTE — H&P (Signed)
TRH H&P   Patient Demographics:    Randy Blackwell, is a 65 y.o. male  MRN: [id number]   DOB - Sep 18, 1951  Admit Date - 07/17/2016  Outpatient Primary MD for the patient is Randy Richard, MD  Referring MD/NP/PA: Elise Benne  Outpatient Specialists: Melida Quitter (ENT)  Patient coming from: home  Chief Complaint  Patient presents with  . Hemoptysis      HPI:    Stepfan Blackwell  is a 65 y.o. 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   Pt presented to ED for evaluation.    In ED,  CT scan showed 5.7 cm hypopharyngeal mass , Copd changes, and Bronchiectasis.  Pt appears to cough and this triggers more blood to come out. Pt will be sent to [facility] due to fact that no ENT service available at [facility].      Review of systems:    In addition to the HPI above,  No Fever-chills, No Headache, No changes with Vision or hearing, No problems swallowing food or Liquids, No Chest pain,  No Abdominal pain, No Nausea or Vommitting, Bowel movements are regular, No Blood in stool or Urine, No dysuria, No new skin rashes or bruises, No new joints pains-aches,  No new weakness, tingling, numbness in any extremity, No recent weight gain or loss, No polyuria, polydypsia or polyphagia, No significant Mental Stressors.  A full 10 point Review of Systems was done, except as stated above, all other Review of Systems were negative.   With Past History of the following :    Past Medical History  Diagnosis Date  . Shortness of breath dyspnea   . GERD (gastroesophageal reflux disease)   . Seizures ([place])     per PCP notes - pt states he's not had a seizure for over a year or more (as of 05/21/16.  [name] Hypertension     per PCP notes - pt states  he's never been prescribed medication for this  . 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 Malignant tumor hypopharynx ([place])   . Alcohol abuse       Past Surgical History  Procedure Laterality Date  . None    . Colonoscopy with propofol N/A 05/05/2016    Procedure: COLONOSCOPY WITH PROPOFOL;  Surgeon: Danie Binder, MD;  [facility];  Service: Endoscopy;  Laterality: N/A;  0730  . Esophagogastroduodenoscopy (egd) with propofol N/A 05/05/2016    Procedure: ESOPHAGOGASTRODUODENOSCOPY (EGD) WITH PROPOFOL;  Surgeon: Danie Binder, MD;  [facility];  Service: Endoscopy;  Laterality: N/A;  . Polypectomy  05/05/2016    Procedure: POLYPECTOMY;  Surgeon: Danie Binder, MD;  [facility];  Service: Endoscopy;;  cecal polyp cold  bx,  . Direct laryngoscopy  05/22/2016  . Direct laryngoscopy N/A 05/22/2016    Procedure: DIRECT LARYNGOSCOPY;  Surgeon: Melida Quitter, MD;  Location: [place];  Service: ENT;  Laterality: N/A;  direct laryngoscopy with biopsy  . Esophagoscopy N/A 05/22/2016    Procedure: ESOPHAGOSCOPY;  Surgeon: Melida Quitter, MD;  Location: [place];  Service: ENT;  Laterality: N/A;  . Tracheostomy        Social History:     Social History  Substance Use Topics  . Smoking status: Current Every Day Smoker -- 1.00 packs/day for 50 years    Types: Cigarettes  . Smokeless tobacco: Never Used  . Alcohol Use: 7.2 oz/week    12 Standard drinks or equivalent per week     Comment: wine and beer. Drinks about a 6 pack of beer a day. About a 1/5 or more of wine a day.      Lives - at home  Mobility -     Family History :     Family History  Problem Relation Age of Onset  . Colon cancer Neg Hx   . Pancreatic cancer Neg Hx   . Stomach cancer Neg Hx   . Diabetes Mother   . Dementia Father   . Seizures Father   . Diabetes Father   . Hypertension Father       Home Medications:   Prior to Admission medications   Medication Sig Start Date End Date Taking?  Authorizing Provider  acetaminophen (TYLENOL) 160 MG/5ML solution Take 160 mg by mouth every 6 (six) hours as needed for mild pain, moderate pain or fever.   Yes Historical Provider, MD  acetaminophen (TYLENOL) 650 MG suppository Place 650 mg rectally every 4 (four) hours as needed. For pain   Yes Historical Provider, MD  furosemide (LASIX) 20 MG tablet Take 20 mg by mouth daily as needed for fluid or edema.    Yes Historical Provider, MD  guaifenesin (ROBITUSSIN) 100 MG/5ML syrup Take 200 mg by mouth 3 (three) times daily as needed for cough.   Yes Historical Provider, MD  haloperidol (HALDOL) 2 MG/ML solution Take 1 mg by mouth every 6 (six) hours as needed.   Yes Historical Provider, MD  hyoscyamine (LEVSIN SL) 0.125 MG SL tablet Take 0.125 mg by mouth every 6 (six) hours as needed.   Yes Historical Provider, MD  LORazepam (ATIVAN) 0.5 MG tablet Take 0.5-1 mg by mouth every 4 (four) hours as needed.    Yes Historical Provider, MD  Morphine Sulfate (MORPHINE CONCENTRATE) 10 mg / 0.5 ml concentrated solution Take 5 mg by mouth every hour as needed. For pain   Yes Historical Provider, MD  Nutritional Supplements (FEEDING SUPPLEMENT, JEVITY 1.5 CAL,) LIQD Place 1,000 mLs into feeding tube daily.   Yes Historical Provider, MD  sennosides (SENEXON) 8.8 MG/5ML syrup Take 5 mLs by mouth daily as needed for mild constipation.   Yes Historical Provider, MD  traZODone (DESYREL) 50 MG tablet Take 50 mg by mouth at bedtime.   Yes Historical Provider, MD     Allergies:    No Known Allergies   Physical Exam:   Vitals  Blood pressure 124/86, pulse 85, temperature 98.7 F (37.1 C), temperature source Oral, resp. rate 18, height 5\' 11"  (1.803 m), weight 49.896 kg (110 lb), SpO2 98 %.   1. General  lying in bed in NAD,  cachectic  2. Normal affect and insight, Not Suicidal or Homicidal, Awake Alert, Oriented X 3.  3. No F.N deficits, ALL C.Nerves Intact, Strength 5/5 all 4 extremities, Sensation intact  all 4 extremities, Plantars down going.  4. Ears and Eyes appear Normal, Conjunctivae clear, PERRLA. Moist Oral Mucosa.  5. + tracheostomy in place, slight blood oozing around his trach.  Supple Neck, No JVD, No cervical lymphadenopathy appriciated, No Carotid Bruits.  6. Symmetrical Chest wall movement, Good air movement bilaterally, CTAB.  7. RRR, No Gallops, Rubs or Murmurs, No Parasternal Heave.  8. Positive Bowel Sounds, Abdomen Soft, No tenderness, No organomegaly appriciated,No rebound -guarding or rigidity.  9.  No Cyanosis, Normal Skin Turgor, No Skin Rash or Bruise.  10. Good muscle tone,  joints appear normal , no effusions, Normal ROM.  11. No Palpable Lymph Nodes in Neck or Axillae     Data Review:    CBC  Recent Labs Lab 07/17/16 1720  WBC 9.3  HGB 11.8*  HCT 34.9*  PLT 412*  MCV 89.3  MCH 30.2  MCHC 33.8  RDW 13.4  LYMPHSABS 1.2  MONOABS 0.6  EOSABS 0.1  BASOSABS 0.0   ------------------------------------------------------------------------------------------------------------------  Chemistries   Recent Labs Lab 07/17/16 1720  NA 134*  K 3.3*  CL 92*  CO2 34*  GLUCOSE 97  BUN 9  CREATININE 0.63  CALCIUM 9.3   ------------------------------------------------------------------------------------------------------------------ estimated creatinine clearance is 67.6 mL/min (by C-G formula based on Cr of 0.63). ------------------------------------------------------------------------------------------------------------------ No results for input(s): TSH, T4TOTAL, T3FREE, THYROIDAB in the last 72 hours.  Invalid input(s): FREET3  Coagulation profile  Recent Labs Lab 07/17/16 1720  INR 0.97   ------------------------------------------------------------------------------------------------------------------- No results for input(s): DDIMER in the last 72  hours. -------------------------------------------------------------------------------------------------------------------  Cardiac Enzymes No results for input(s): CKMB, TROPONINI, MYOGLOBIN in the last 168 hours.  Invalid input(s): CK ------------------------------------------------------------------------------------------------------------------ No results found for: BNP   ---------------------------------------------------------------------------------------------------------------  Urinalysis    Component Value Date/Time   COLORURINE YELLOW 04/23/2016 1313   APPEARANCEUR HAZY* 04/23/2016 1313   LABSPEC 1.025 04/23/2016 1313   PHURINE 6.0 04/23/2016 1313   GLUCOSEU NEGATIVE 04/23/2016 1313   HGBUR NEGATIVE 04/23/2016 1313   BILIRUBINUR SMALL* 04/23/2016 1313   KETONESUR TRACE* 04/23/2016 1313   PROTEINUR 30* 04/23/2016 1313   NITRITE NEGATIVE 04/23/2016 1313   LEUKOCYTESUR MODERATE* 04/23/2016 1313    ----------------------------------------------------------------------------------------------------------------   Imaging Results:    Ct Soft Tissue Neck W Contrast  07/17/2016  CLINICAL DATA:  Tracheostomy dislodged earlier today, reinserted by the patient. Coughing up blood since then. Known pharyngeal mass. EXAM: CT NECK WITH CONTRAST TECHNIQUE: Multidetector CT imaging of the neck was performed using the standard protocol following the bolus administration of intravenous contrast. CONTRAST:  164mL ISOVUE-300 IOPAMIDOL (ISOVUE-300) INJECTION 61% COMPARISON:  CT 06/03/2016 FINDINGS: Tracheostomy tube appears grossly appropriately positioned. No soft tissue complications seen at the tracheostomy site. Large hypo pharyngeal to supraglottic mass remains evident as seen previously. Diameter of this lesion is at least 5.7 cm. Pronounced mass effect upon the hypopharynx in the upper airway. No evidence of vascular occlusion. There is soft tissue edema in the region probably secondary  to radiation. Chronic spondylosis as seen previously. IMPRESSION: Tracheostomy appears to be repositioned without complication and appears grossly well positioned. Large right hypo pharyngeal to supraglottic mass measuring up to 5.7 cm in diameter as seen previously. Regional edema probably relates to radiation. No qualitative change since the previous study. Electronically Signed   By: Nelson Chimes M.D.   On: 07/17/2016 20:14   Ct Chest W Contrast  07/17/2016  CLINICAL DATA:  Bleeding  from tracheostomy site. Known pharyngeal mass. EXAM: CT CHEST WITH CONTRAST TECHNIQUE: Multidetector CT imaging of the chest was performed during intravenous contrast administration. CONTRAST:  197mL ISOVUE-300 IOPAMIDOL (ISOVUE-300) INJECTION 61% COMPARISON:  CT of the chest April 10, 2016 and CT of the neck June 03, 2016 FINDINGS: The patient's tracheostomy tube is in good position. A small amount of fluid is seen posteriorly in the trachea, probably mucus. The central airways are otherwise normal. Mild scarring is seen in the lung apices. Emphysematous changes are seen in the lungs. Bronchiectasis remains in the right lower lobe, not significantly changed. Just distal and posterior to the bronchiectasis is more focal opacity, unchanged, likely scarring. No suspicious pulmonary nodules or masses. Mild ground-glass in the right middle lobe and central right lower lobe is likely infectious or inflammatory process. No other focal infiltrate. The patient has known large pharyngeal mass is again identified extending from the hypopharynx to below the level the cricoid posteriorly. The mass appears to extend more inferiorly within the larynx. A full description of the known mass will be made on the CT of the neck. A few prominent nodes are seen in the right neck. No effusions. No adenopathy is seen within the chest. The thoracic aorta is non aneurysmal with scattered atherosclerotic change. The heart is unchanged. There are coronary  artery calcifications. Evaluation of the central pulmonary arteries is unremarkable. Evaluation of the upper abdomen is limited but unchanged and unremarkable. No acute bony abnormalities. IMPRESSION: 1. The patient's known pharyngeal mass, extending into the larynx, may have progressed in the interval. However, the mass will be more fully described on the CT of the neck. 2. Bronchiectasis in the right lower lobe with adjacent soft tissue opacity, likely scarring, is unchanged. 3. No other acute abnormalities in the chest. Electronically Signed   By: Dorise Bullion III M.D   On: 07/17/2016 20:17       Assessment & Plan:    Active Problems:   Carcinoma of hypopharynx (HCC)   Hemoptysis   Hypokalemia   Hyponatremia   Anemia    1. Bleeding around trach vs Hemoptysis ?  NPO,  Type and screen ENT consult  Transfer to [facility] due to fact that there is no ENT service available here.   2. Hypokalemia Replete Check cmp in am  3.  Hyponatremia Hydrate with ns iv gently.  Check cmp in am  4. Anemia Type and screen, check cbc in am  5. Hypopharyngeal mass Appreciate ENT input.   DVT Prophylaxis  SCDs , no heparin or lovenox due to bleeding.   AM Labs Ordered, also please review Full Orders  Family Communication: Admission, patients condition and plan of care including tests being ordered have been discussed with the patient  who indicate understanding and agree with the plan and Code Status.  Code Status FULL CODE  Likely DC to    Condition GUARDED    Consults called: ENT    Admission status: inpatient  Time spent in minutes : 45 minutes./    Jani Gravel M.D on 07/17/2016 at 8:50 PM  Between 7am to 7pm - Pager - [phone]. After 7pm go to [email or web address] - password [facility]  [facility] - Office  [phone]

## 2016-07-17 NOTE — ED Notes (Addendum)
Patient states his trach came out earlier and he put it back in himself but has been coughing up blood and his nurse referred him to ER. Patient denies pain or shortness of breath.

## 2016-07-17 NOTE — ED Notes (Signed)
Report given to [place] on [place] at [facility]

## 2016-07-17 NOTE — ED Notes (Signed)
Report given to [facility] with [facility]. ETA 20-25 mins

## 2016-07-17 NOTE — Progress Notes (Signed)
D/w Dr. Constance Holster ENT will be by in am to see patient,  Appreciate their input.

## 2016-07-17 NOTE — ED Provider Notes (Signed)
CSN: [id number]     Arrival date & time 07/17/16  1532 History   First MD Initiated Contact with Patient 07/17/16 1617     Chief Complaint  Patient presents with  . Hemoptysis      HPI  Pt was seen at 46. Per pt and family, c/o gradual onset and persistence of constant "coughing up blood through trach" since yesterday. Pt states the trach was changed yesterday and he "saw a little blood" when he coughed. Pt states he has been coughing more today, and "bringing up more blood." Pt states he coughed so hard his trach came out; which he put back in himself. Pt has significant hx of pharyngeal tumor. Not on chemo or XRT at this time. Denies SOB, no CP/palpitations, no abd pain, no N/V/D.   ENT: [facility] Past Medical History  Diagnosis Date  . Shortness of breath dyspnea   . GERD (gastroesophageal reflux disease)   . Seizures ([place])     per PCP notes - pt states he's not had a seizure for over a year or more (as of 05/21/16.  [name] Hypertension     per PCP notes - pt states he's never been prescribed medication for this  . 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 Alcohol abuse    Past Surgical History  Procedure Laterality Date  . None    . Colonoscopy with propofol N/A 05/05/2016    Procedure: COLONOSCOPY WITH PROPOFOL;  Surgeon: Danie Binder, MD;  [facility];  Service: Endoscopy;  Laterality: N/A;  0730  . Esophagogastroduodenoscopy (egd) with propofol N/A 05/05/2016    Procedure: ESOPHAGOGASTRODUODENOSCOPY (EGD) WITH PROPOFOL;  Surgeon: Danie Binder, MD;  [facility];  Service: Endoscopy;  Laterality: N/A;  . Polypectomy  05/05/2016    Procedure: POLYPECTOMY;  Surgeon: Danie Binder, MD;  [facility];  Service: Endoscopy;;  cecal polyp cold bx,  . Direct laryngoscopy  05/22/2016  . Direct laryngoscopy N/A 05/22/2016    Procedure: DIRECT LARYNGOSCOPY;  Surgeon: Melida Quitter, MD;  Location: [place];  Service: ENT;  Laterality: N/A;   direct laryngoscopy with biopsy  . Esophagoscopy N/A 05/22/2016    Procedure: ESOPHAGOSCOPY;  Surgeon: Melida Quitter, MD;  Location: [facility] OR;  Service: ENT;  Laterality: N/A;  . Tracheostomy     Family History  Problem Relation Age of Onset  . Colon cancer Neg Hx   . Pancreatic cancer Neg Hx   . Stomach cancer Neg Hx   . Diabetes Mother    Social History  Substance Use Topics  . Smoking status: Current Every Day Smoker -- 1.00 packs/day for 50 years    Types: Cigarettes  . Smokeless tobacco: Never Used  . Alcohol Use: 0.0 oz/week    0 Standard drinks or equivalent per week     Comment: wine and beer. Drinks about a 6 pack of beer a day. About a 1/5 or more of wine a day.     Review of Systems ROS: Statement: All systems negative except as marked or noted in the HPI; Constitutional: Negative for fever and chills. ; ; Eyes: Negative for eye pain, redness and discharge. ; ; ENMT: Negative for ear pain, hoarseness, nasal congestion, sinus pressure and sore throat. ; ; Cardiovascular: Negative for chest pain, palpitations, diaphoresis, dyspnea and peripheral edema. ; ; Respiratory: +cough, "bleeding through trach." Negative for wheezing and stridor. ; ; Gastrointestinal: Negative for nausea,  vomiting, diarrhea, abdominal pain, blood in stool, hematemesis, jaundice and rectal bleeding. . ; ; Genitourinary: Negative for dysuria, flank pain and hematuria. ; ; Musculoskeletal: Negative for back pain and neck pain. Negative for swelling and trauma.; ; Skin: Negative for pruritus, rash, abrasions, blisters, bruising and skin lesion.; ; Neuro: Negative for headache, lightheadedness and neck stiffness. Negative for weakness, altered level of consciousness, altered mental status, extremity weakness, paresthesias, involuntary movement, seizure and syncope.      Allergies  Review of patient's allergies indicates no known allergies.  Home Medications   Prior to Admission medications   Medication Sig  Start Date End Date Taking? Authorizing Provider  acetaminophen (TYLENOL) 650 MG suppository Place 650 mg rectally every 4 (four) hours as needed. For pain    Historical Provider, MD  ENSURE (ENSURE) Take 237 mLs by mouth 3 (three) times daily between meals.    Historical Provider, MD  furosemide (LASIX) 20 MG tablet Take 20 mg by mouth daily as needed.    Historical Provider, MD  haloperidol (HALDOL) 2 MG/ML solution Take 1 mg by mouth every 6 (six) hours as needed.    Historical Provider, MD  hyoscyamine (LEVSIN SL) 0.125 MG SL tablet Take 0.125 mg by mouth every 6 (six) hours as needed.    Historical Provider, MD  LORazepam (ATIVAN) 0.5 MG tablet Take 0.5 mg by mouth every 4 (four) hours as needed.    Historical Provider, MD  Morphine Sulfate (MORPHINE CONCENTRATE) 10 mg / 0.5 ml concentrated solution Take 5 mg by mouth every hour as needed. For pain    Historical Provider, MD   BP 114/77 mmHg  Pulse 100  Temp(Src) 98.7 F (37.1 C) (Oral)  Resp 19  Ht 5\' 11"  (1.803 m)  Wt 110 lb (49.896 kg)  BMI 15.35 kg/m2  SpO2 100% Physical Exam  1630: Physical examination:  Nursing notes reviewed; Vital signs and O2 SAT reviewed;  Constitutional: Well developed, Well nourished, Well hydrated, In no acute distress; Head:  Normocephalic, atraumatic; Eyes: EOMI, PERRL, No scleral icterus; ENMT: Mouth and pharynx normal, Mucous membranes moist; Neck: Supple, Full range of motion, +trach in place. Pt coughing up dark red blood through trach.; Cardiovascular: Regular rate and rhythm, No gallop; Respiratory: Breath sounds clear & equal bilaterally, No wheezes. +intermittent cough. Speaking full sentences with ease, Normal respiratory effort/excursion; Chest: Nontender, Movement normal; Abdomen: Soft, Nontender, Nondistended, Normal bowel sounds; Genitourinary: No CVA tenderness; Extremities: Pulses normal, No tenderness, No edema, No calf edema or asymmetry.; Neuro: AA&Ox3, Major CN grossly intact.  Speech clear.  No gross focal motor or sensory deficits in extremities.; Skin: Color normal, Warm, Dry.   ED Course  Procedures (including critical care time) Labs Review  Imaging Review  I have personally reviewed and evaluated these images and lab results as part of my medical decision-making.   EKG Interpretation None      MDM  MDM Reviewed: previous chart, nursing note and vitals Reviewed previous: labs Interpretation: labs and CT scan     Results for orders placed or performed during the hospital encounter of 07/17/16  Protime-INR  Result Value Ref Range   Prothrombin Time 13.1 11.6 - 15.2 seconds   INR 0.97 0.00 - 1.49  CBC with Differential  Result Value Ref Range   WBC 9.3 4.0 - 10.5 K/uL   RBC 3.91 (L) 4.22 - 5.81 MIL/uL   Hemoglobin 11.8 (L) 13.0 - 17.0 g/dL   HCT 34.9 (L) 39.0 - 52.0 %  MCV 89.3 78.0 - 100.0 fL   MCH 30.2 26.0 - 34.0 pg   MCHC 33.8 30.0 - 36.0 g/dL   RDW 13.4 11.5 - 15.5 %   Platelets 412 (H) 150 - 400 K/uL   Neutrophils Relative % 80 %   Neutro Abs 7.4 1.7 - 7.7 K/uL   Lymphocytes Relative 13 %   Lymphs Abs 1.2 0.7 - 4.0 K/uL   Monocytes Relative 6 %   Monocytes Absolute 0.6 0.1 - 1.0 K/uL   Eosinophils Relative 1 %   Eosinophils Absolute 0.1 0.0 - 0.7 K/uL   Basophils Relative 0 %   Basophils Absolute 0.0 0.0 - 0.1 K/uL  Basic metabolic panel  Result Value Ref Range   Sodium 134 (L) 135 - 145 mmol/L   Potassium 3.3 (L) 3.5 - 5.1 mmol/L   Chloride 92 (L) 101 - 111 mmol/L   CO2 34 (H) 22 - 32 mmol/L   Glucose, Bld 97 65 - 99 mg/dL   BUN 9 6 - 20 mg/dL   Creatinine, Ser 0.63 0.61 - 1.24 mg/dL   Calcium 9.3 8.9 - 10.3 mg/dL   GFR calc non Af Amer >60 >60 mL/min   GFR calc Af Amer >60 >60 mL/min   Anion gap 8 5 - 15   Ct Soft Tissue Neck W Contrast 07/17/2016  CLINICAL DATA:  Tracheostomy dislodged earlier today, reinserted by the patient. Coughing up blood since then. Known pharyngeal mass. EXAM: CT NECK WITH CONTRAST TECHNIQUE:  Multidetector CT imaging of the neck was performed using the standard protocol following the bolus administration of intravenous contrast. CONTRAST:  17mL ISOVUE-300 IOPAMIDOL (ISOVUE-300) INJECTION 61% COMPARISON:  CT 06/03/2016 FINDINGS: Tracheostomy tube appears grossly appropriately positioned. No soft tissue complications seen at the tracheostomy site. Large hypo pharyngeal to supraglottic mass remains evident as seen previously. Diameter of this lesion is at least 5.7 cm. Pronounced mass effect upon the hypopharynx in the upper airway. No evidence of vascular occlusion. There is soft tissue edema in the region probably secondary to radiation. Chronic spondylosis as seen previously. IMPRESSION: Tracheostomy appears to be repositioned without complication and appears grossly well positioned. Large right hypo pharyngeal to supraglottic mass measuring up to 5.7 cm in diameter as seen previously. Regional edema probably relates to radiation. No qualitative change since the previous study. Electronically Signed   By: Nelson Chimes M.D.   On: 07/17/2016 20:14   Ct Chest W Contrast 07/17/2016  CLINICAL DATA:  Bleeding from tracheostomy site. Known pharyngeal mass. EXAM: CT CHEST WITH CONTRAST TECHNIQUE: Multidetector CT imaging of the chest was performed during intravenous contrast administration. CONTRAST:  178mL ISOVUE-300 IOPAMIDOL (ISOVUE-300) INJECTION 61% COMPARISON:  CT of the chest April 10, 2016 and CT of the neck June 03, 2016 FINDINGS: The patient's tracheostomy tube is in good position. A small amount of fluid is seen posteriorly in the trachea, probably mucus. The central airways are otherwise normal. Mild scarring is seen in the lung apices. Emphysematous changes are seen in the lungs. Bronchiectasis remains in the right lower lobe, not significantly changed. Just distal and posterior to the bronchiectasis is more focal opacity, unchanged, likely scarring. No suspicious pulmonary nodules or masses. Mild  ground-glass in the right middle lobe and central right lower lobe is likely infectious or inflammatory process. No other focal infiltrate. The patient has known large pharyngeal mass is again identified extending from the hypopharynx to below the level the cricoid posteriorly. The mass appears to extend more inferiorly within the  larynx. A full description of the known mass will be made on the CT of the neck. A few prominent nodes are seen in the right neck. No effusions. No adenopathy is seen within the chest. The thoracic aorta is non aneurysmal with scattered atherosclerotic change. The heart is unchanged. There are coronary artery calcifications. Evaluation of the central pulmonary arteries is unremarkable. Evaluation of the upper abdomen is limited but unchanged and unremarkable. No acute bony abnormalities. IMPRESSION: 1. The patient's known pharyngeal mass, extending into the larynx, may have progressed in the interval. However, the mass will be more fully described on the CT of the neck. 2. Bronchiectasis in the right lower lobe with adjacent soft tissue opacity, likely scarring, is unchanged. 3. No other acute abnormalities in the chest. Electronically Signed   By: Dorise Bullion III M.D   On: 07/17/2016 20:17    Results for KUE, FAMBROUGH (MRN [id number]) as of 07/17/2016 20:39  Ref. Range 06/17/2014 10:58 04/23/2016 11:34 05/22/2016 07:41 07/17/2016 17:20  Hemoglobin Latest Ref Range: 13.0-17.0 g/dL 12.0 (L) 13.5 15.6 11.8 (L)  HCT Latest Ref Range: 39.0-52.0 % 35.2 (L) 39.5 45.9 34.9 (L)    2040:  RT has cleaned trach, suctioned blood clots from oral airway. Bleeding from trach has slowed significantly since arrival to ED (on arrival had filled the bottom of an emesis bag full of dark blood + clots). Pt no longer coughing and only blood currently is on the dressing surrounding the trach site. Pt's Sats now 98% R/A from 100% on 7L trach collar.  Pt lives alone and has a significant etoh abuse hx;  concern for ongoing bleeding and ability to care for it alone at home; family shares same concerns.  Dx and testing d/w pt and family.  Questions answered.  Verb understanding, agreeable to admit. T/C to [facility]. Maudie Mercury, case discussed, including:  HPI, pertinent PM/SHx, VS/PE, dx testing, ED course and treatment:  Agreeable to admit, requests to write temporary orders, obtain tele bed to [facility]/Dr. Opyd.    Francine Graven, DO 07/18/16 2212

## 2016-07-17 NOTE — ED Notes (Signed)
Pt returned from CT 99% on RA

## 2016-07-18 DIAGNOSIS — R042 Hemoptysis: Principal | ICD-10-CM

## 2016-07-18 LAB — CBC
HEMATOCRIT: 35.1 % — AB (ref 39.0–52.0)
HEMOGLOBIN: 11.7 g/dL — AB (ref 13.0–17.0)
MCH: 29.8 pg (ref 26.0–34.0)
MCHC: 33.3 g/dL (ref 30.0–36.0)
MCV: 89.5 fL (ref 78.0–100.0)
Platelets: 475 10*3/uL — ABNORMAL HIGH (ref 150–400)
RBC: 3.92 MIL/uL — ABNORMAL LOW (ref 4.22–5.81)
RDW: 13.3 % (ref 11.5–15.5)
WBC: 11.6 10*3/uL — ABNORMAL HIGH (ref 4.0–10.5)

## 2016-07-18 LAB — COMPREHENSIVE METABOLIC PANEL
ALBUMIN: 2.9 g/dL — AB (ref 3.5–5.0)
ALK PHOS: 75 U/L (ref 38–126)
ALT: 10 U/L — ABNORMAL LOW (ref 17–63)
ANION GAP: 8 (ref 5–15)
AST: 17 U/L (ref 15–41)
BUN: 6 mg/dL (ref 6–20)
CALCIUM: 9.7 mg/dL (ref 8.9–10.3)
CO2: 35 mmol/L — AB (ref 22–32)
Chloride: 93 mmol/L — ABNORMAL LOW (ref 101–111)
Creatinine, Ser: 0.7 mg/dL (ref 0.61–1.24)
GFR calc Af Amer: >60 mL/min (ref >60)
GFR calc non Af Amer: >60 mL/min (ref >60)
GLUCOSE: 121 mg/dL — AB (ref 65–99)
POTASSIUM: 4.3 mmol/L (ref 3.5–5.1)
SODIUM: 136 mmol/L (ref 135–145)
Total Bilirubin: 0.8 mg/dL (ref 0.3–1.2)
Total Protein: 6.5 g/dL (ref 6.5–8.1)

## 2016-07-18 LAB — GLUCOSE, CAPILLARY
GLUCOSE-CAPILLARY: 107 mg/dL — AB (ref 65–99)
GLUCOSE-CAPILLARY: 153 mg/dL — AB (ref 65–99)
GLUCOSE-CAPILLARY: 161 mg/dL — AB (ref 65–99)
Glucose-Capillary: 113 mg/dL — ABNORMAL HIGH (ref 65–99)

## 2016-07-18 LAB — TYPE AND SCREEN
ABO/RH(D): O POS
Antibody Screen: NEGATIVE

## 2016-07-18 LAB — ABO/RH: ABO/RH(D): O POS

## 2016-07-18 MED ORDER — LIDOCAINE VISCOUS 2 % MT SOLN
15.0000 mL | Freq: Once | OROMUCOSAL | Status: AC
  Administered 2016-07-18: 15 mL via OROMUCOSAL
  Filled 2016-07-18: qty 15

## 2016-07-18 MED ORDER — CETYLPYRIDINIUM CHLORIDE 0.05 % MT LIQD: 7.0000 mL | Freq: Two times a day (BID) | OROMUCOSAL | Status: DC

## 2016-07-18 MED ORDER — CHLORHEXIDINE GLUCONATE 0.12 % MT SOLN
15.0000 mL | Freq: Two times a day (BID) | OROMUCOSAL | Status: DC
  Administered 2016-07-18: 15 mL via OROMUCOSAL
  Filled 2016-07-18: qty 15

## 2016-07-18 NOTE — Progress Notes (Signed)
Patient will DC to: Home Anticipated DC date: 07/18/16 Family notified:  Transport by: Corey Harold   Per MD patient ready for DC home. RN, patient, patient's family notified of DC. DC packet on chart. Ambulance transport requested for patient. Dispatch stated they were busy.  CSW signing off.  Cedric Fishman, [place] Social Worker [phone]

## 2016-07-18 NOTE — Progress Notes (Signed)
Patient ambulated around the nurses station once and tolerated well with one person assist. sats maintained above 95%on RA.

## 2016-07-18 NOTE — Progress Notes (Signed)
Patient discharged home social worker  informed and transport arranged with [facility]. Patient later stated that he would like to go home with private/personal vehicle instead of  [facility]. Both patient and family educated regarding the risks and verbalized understanding. Escorted to the family car via wheelchair stable.

## 2016-07-18 NOTE — Discharge Summary (Signed)
Physician Discharge Summary  Randy Blackwell [id number] DOB: [birth year] DOA: 07/17/2016  PCP: [name], MD  Admit date: 07/17/2016 Discharge date: 07/18/2016  Time spent: 45 minutes  Recommendations for Outpatient Follow-up:  1. 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   Pt presented to ED for evaluation.    In ED,  CT scan showed 5.7 cm hypopharyngeal mass , Copd changes, and Bronchiectasis.  Pt appears to cough and this triggers more blood to come out. Pt will be sent to [facility] due to fact that no ENT service available at [facility].   Hospital Course:  Serial H/H stable. Seen by ENT. Morbid outlook. Pt opted to go home to continue palliative measures. Wanted not interventions.  Procedures:  n/a (i.e. Studies not automatically included, echos, thoracentesis, etc; not x-rays)  Consultations:  ENT  Discharge Exam: Filed Vitals:   07/18/16 1221 07/18/16 1300  BP:  117/67  Pulse: 81 86  Temp:  98.4 F (36.9 C)  Resp: 16 17     General:  No diaphoresis, anxious, no acute distress  Cardiovascular: Regular rate and rhythm no murmurs rubs or gallops  Respiratory: Clear to auscultation bilaterally no more breathing  Abdomen: Nondistended bowel sounds normal nontender palpation  Musculoskeletal: Moving all extremities, no deformity, 5 out of 5 strength    Discharge Instructions    Current Discharge Medication List    CONTINUE these medications which  have NOT CHANGED   Details  acetaminophen (TYLENOL) 160 MG/5ML solution Take 160 mg by mouth every 6 (six) hours as needed for mild pain, moderate pain or fever.    acetaminophen (TYLENOL) 650 MG suppository Place 650 mg rectally every 4 (four) hours as needed. For pain    furosemide (LASIX) 20 MG tablet Take 20 mg by mouth daily as needed for fluid or edema.     guaifenesin (ROBITUSSIN) 100 MG/5ML syrup Take 200 mg by mouth 3 (three) times daily as needed for cough.    haloperidol (HALDOL) 2 MG/ML solution Take 1 mg by mouth every 6 (six) hours as needed.    hyoscyamine (LEVSIN SL) 0.125 MG SL tablet Take 0.125 mg by mouth every 6 (six) hours as needed.    LORazepam (ATIVAN) 0.5 MG tablet Take 0.5-1 mg by mouth every 4 (four) hours as needed.     Morphine Sulfate (MORPHINE CONCENTRATE) 10 mg / 0.5 ml concentrated solution Take 5 mg by mouth every hour as needed. For pain    Nutritional Supplements (FEEDING SUPPLEMENT, JEVITY 1.5 CAL,) LIQD Place 1,000 mLs into feeding tube daily.    sennosides (SENEXON) 8.8 MG/5ML syrup Take 5 mLs by mouth daily as needed for mild constipation.    traZODone (DESYREL) 50 MG tablet Take 50 mg by mouth at bedtime.       No Known Allergies Follow-up Information    Follow up with [name], MD In 3 days.   Specialty:  Internal Medicine  Why:  coughing up blood, hospice   Contact information:   [address] [place] [address] [phone]        The results of significant diagnostics from this hospitalization (including imaging, microbiology, ancillary and laboratory) are listed below for reference.    Significant Diagnostic Studies: Ct Soft Tissue Neck W Contrast  07/17/2016  CLINICAL DATA:  Tracheostomy dislodged earlier today, reinserted by the patient. Coughing up blood since then. Known pharyngeal mass. EXAM: CT NECK WITH CONTRAST TECHNIQUE: Multidetector CT imaging of the neck was performed using the standard protocol  following the bolus administration of intravenous contrast. CONTRAST:  171mL ISOVUE-300 IOPAMIDOL (ISOVUE-300) INJECTION 61% COMPARISON:  CT 06/03/2016 FINDINGS: Tracheostomy tube appears grossly appropriately positioned. No soft tissue complications seen at the tracheostomy site. Large hypo pharyngeal to supraglottic mass remains evident as seen previously. Diameter of this lesion is at least 5.7 cm. Pronounced mass effect upon the hypopharynx in the upper airway. No evidence of vascular occlusion. There is soft tissue edema in the region probably secondary to radiation. Chronic spondylosis as seen previously. IMPRESSION: Tracheostomy appears to be repositioned without complication and appears grossly well positioned. Large right hypo pharyngeal to supraglottic mass measuring up to 5.7 cm in diameter as seen previously. Regional edema probably relates to radiation. No qualitative change since the previous study. Electronically Signed   By: Nelson Chimes M.D.   On: 07/17/2016 20:14   Ct Chest W Contrast  07/17/2016  CLINICAL DATA:  Bleeding from tracheostomy site. Known pharyngeal mass. EXAM: CT CHEST WITH CONTRAST TECHNIQUE: Multidetector CT imaging of the chest was performed during intravenous contrast administration. CONTRAST:  125mL ISOVUE-300 IOPAMIDOL (ISOVUE-300) INJECTION 61% COMPARISON:  CT of the chest April 10, 2016 and CT of the neck June 03, 2016 FINDINGS: The patient's tracheostomy tube is in good position. A small amount of fluid is seen posteriorly in the trachea, probably mucus. The central airways are otherwise normal. Mild scarring is seen in the lung apices. Emphysematous changes are seen in the lungs. Bronchiectasis remains in the right lower lobe, not significantly changed. Just distal and posterior to the bronchiectasis is more focal opacity, unchanged, likely scarring. No suspicious pulmonary nodules or masses. Mild ground-glass in the right middle lobe and central right lower lobe is likely  infectious or inflammatory process. No other focal infiltrate. The patient has known large pharyngeal mass is again identified extending from the hypopharynx to below the level the cricoid posteriorly. The mass appears to extend more inferiorly within the larynx. A full description of the known mass will be made on the CT of the neck. A few prominent nodes are seen in the right neck. No effusions. No adenopathy is seen within the chest. The thoracic aorta is non aneurysmal with scattered atherosclerotic change. The heart is unchanged. There are coronary artery calcifications. Evaluation of the central pulmonary arteries is unremarkable. Evaluation of the upper abdomen is limited but unchanged and unremarkable. No acute bony abnormalities. IMPRESSION: 1. The patient's known pharyngeal mass, extending into the larynx, may have progressed in the interval. However, the mass will be more fully described on the CT of the neck. 2. Bronchiectasis in the right lower lobe with adjacent soft tissue opacity, likely scarring, is unchanged. 3. No other acute abnormalities in the chest. Electronically Signed   By: Dorise Bullion III M.D   On: 07/17/2016 20:17    Microbiology: No results found for this or any previous visit (from the past 240 hour(s)).  Labs: Basic Metabolic Panel:  Recent Labs Lab 07/17/16 1720 07/18/16 0104  NA 134* 136  K 3.3* 4.3  CL 92* 93*  CO2 34* 35*  GLUCOSE 97 121*  BUN 9 6  CREATININE 0.63 0.70  CALCIUM 9.3 9.7   Liver Function Tests:  Recent Labs Lab 07/18/16 0104  AST 17  ALT 10*  ALKPHOS 75  BILITOT 0.8  PROT 6.5  ALBUMIN 2.9*   No results for input(s): LIPASE, AMYLASE in the last 168 hours. No results for input(s): AMMONIA in the last 168 hours. CBC:  Recent Labs Lab 07/17/16 1720 07/18/16 0104  WBC 9.3 11.6*  NEUTROABS 7.4  --   HGB 11.8* 11.7*  HCT 34.9* 35.1*  MCV 89.3 89.5  PLT 412* 475*   Cardiac Enzymes: No results for input(s): CKTOTAL, CKMB,  CKMBINDEX, TROPONINI in the last 168 hours. BNP: BNP (last 3 results) No results for input(s): BNP in the last 8760 hours.  ProBNP (last 3 results) No results for input(s): PROBNP in the last 8760 hours.  CBG:  Recent Labs Lab 07/18/16 0021 07/18/16 0447 07/18/16 0755 07/18/16 1149  GLUCAP 113* 107* 153* 161*       Signed:  Elwin Mocha MD  FACP  [facility] 07/18/2016, 4:12 PM

## 2016-07-18 NOTE — Progress Notes (Signed)
Nutrition Brief Note  Patient identified on the Malnutrition Screening Tool (MST) Report as a home tube feeder  Wt Readings from Last 15 Encounters:  07/17/16 88 lb 10 oz (40.2 kg)  05/29/16 105 lb (47.628 kg)  05/23/16 105 lb 2.6 oz (47.7 kg)  05/01/16 112 lb (50.803 kg)  04/30/16 111 lb 6.4 oz (50.531 kg)  04/23/16 130 lb (58.968 kg)  06/17/14 130 lb (58.968 kg)    Body mass index is 12.37 kg/(m^2). Patient meets criteria for underweight  based on current BMI.   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 ENT MD worked bleed up as originating from cancer mass, not his trach. Pt elected to have no further procedures done.   RD stopped by to go over tube feeding at home and make sure he is not having any TF related complications. He states he infuses 4 cas of jevity 1.5/ day. He flushes before and after each feed. Denies any tube clogs or n/v/c/d related to the TF.   Pt largely disinterested in conversing and is very upset he is still here. He wants to go home  Current diet order is NPO.   No nutrition interventions warranted at this time. If nutrition issues arise, please consult RD.   Burtis Junes RD, LDN, CNSC Clinical Nutrition Pager: [phone] 07/18/2016 3:13 PM

## 2016-07-18 NOTE — Consult Note (Signed)
  Consulted for hemoptysis.  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 He underwent tracheostomy at [facility] almost 2 weeks ago. They had discussed possible surgical treatment but felt that he was a very poor surgical candidate and the cure rate was very low. The patient had decided not to pursue surgery and to be enrolled in hospice care instead.  Yesterday, he started having hemoptysis. His tracheostomy fell out but he was able to replace it. He is fed exclusively by gastrostomy tube.  On examination today, the tracheostomy is in place and stable. He has coughed up some blood tinged secretions most of the night but it has slowed down significantly.  Examination of the tracheostomy with a fiberoptic laryngoscope was performed. The trachea itself is clear. There is no obvious bleeding site in the trachea even with pulling back on the tracheostomy tube. After topical viscous lidocaine was applied to the nasal cavity mucosa bilaterally. Attempts were made to examine transnasally on both sides. I cannot get past the nasopharynx on either side due to what appears to be a large mass. I can't see any anatomy. There is no active bleeding but there does appear to be some clotted blood.  Based on the findings, the bleeding is coming from the tumor, not from the tracheostomy site itself. I discussed with the patient and 3 family members 2 options. One option is to do nothing and continue to monitor. The second option is to have him evaluated by interventional radiology to see if a feeding vessel or vessels can be identified and occluded. He is not very interested in pursuing this right now. He would also like to go home. I think this is reasonable given the fact that he is terminally ill and currently in hospice care. If he changes his mind we can have him evaluated by interventional radiology. Contact me if needed.

## 2016-07-30 ENCOUNTER — Encounter (HOSPITAL_COMMUNITY): Payer: Self-pay | Admitting: Emergency Medicine

## 2016-07-30 ENCOUNTER — Emergency Department (HOSPITAL_COMMUNITY): Payer: Medicaid Other

## 2016-07-30 ENCOUNTER — Observation Stay (HOSPITAL_COMMUNITY)
Admission: EM | Admit: 2016-07-30 | Discharge: 2016-07-30 | Disposition: A | Discharge status: Home / Self Care | Payer: Medicaid Other | Attending: Internal Medicine | Admitting: Internal Medicine

## 2016-07-30 DIAGNOSIS — F17210 Nicotine dependence, cigarettes, uncomplicated: Secondary | ICD-10-CM | POA: Diagnosis not present

## 2016-07-30 DIAGNOSIS — J9501 Hemorrhage from tracheostomy stoma: Secondary | ICD-10-CM | POA: Diagnosis not present

## 2016-07-30 DIAGNOSIS — Z79899 Other long term (current) drug therapy: Secondary | ICD-10-CM | POA: Diagnosis not present

## 2016-07-30 DIAGNOSIS — Z66 Do not resuscitate: Secondary | ICD-10-CM

## 2016-07-30 DIAGNOSIS — C139 Malignant neoplasm of hypopharynx, unspecified: Secondary | ICD-10-CM

## 2016-07-30 DIAGNOSIS — R042 Hemoptysis: Secondary | ICD-10-CM | POA: Diagnosis not present

## 2016-07-30 DIAGNOSIS — I10 Essential (primary) hypertension: Secondary | ICD-10-CM | POA: Diagnosis not present

## 2016-07-30 DIAGNOSIS — J449 Chronic obstructive pulmonary disease, unspecified: Secondary | ICD-10-CM | POA: Diagnosis not present

## 2016-07-30 DIAGNOSIS — Z515 Encounter for palliative care: Secondary | ICD-10-CM

## 2016-07-30 LAB — CBC WITH DIFFERENTIAL/PLATELET
BASOS ABS: 0 10*3/uL (ref 0.0–0.1)
Basophils Relative: 0 %
EOS PCT: 1 %
Eosinophils Absolute: 0.1 10*3/uL (ref 0.0–0.7)
HCT: 31.3 % — ABNORMAL LOW (ref 39.0–52.0)
Hemoglobin: 10.7 g/dL — ABNORMAL LOW (ref 13.0–17.0)
LYMPHS PCT: 10 %
Lymphs Abs: 0.8 10*3/uL (ref 0.7–4.0)
MCH: 30.1 pg (ref 26.0–34.0)
MCHC: 34.2 g/dL (ref 30.0–36.0)
MCV: 87.9 fL (ref 78.0–100.0)
Monocytes Absolute: 0.9 10*3/uL (ref 0.1–1.0)
Monocytes Relative: 12 %
NEUTROS ABS: 5.8 10*3/uL (ref 1.7–7.7)
NEUTROS PCT: 77 %
PLATELETS: 438 10*3/uL — AB (ref 150–400)
RBC: 3.56 MIL/uL — AB (ref 4.22–5.81)
RDW: 13.1 % (ref 11.5–15.5)
WBC: 7.5 10*3/uL (ref 4.0–10.5)

## 2016-07-30 LAB — BASIC METABOLIC PANEL
ANION GAP: 5 (ref 5–15)
BUN: 25 mg/dL — ABNORMAL HIGH (ref 6–20)
CO2: 44 mmol/L — ABNORMAL HIGH (ref 22–32)
Calcium: 8.8 mg/dL — ABNORMAL LOW (ref 8.9–10.3)
Chloride: 79 mmol/L — ABNORMAL LOW (ref 101–111)
Creatinine, Ser: 1.06 mg/dL (ref 0.61–1.24)
GFR calc Af Amer: >60 mL/min (ref >60)
GLUCOSE: 193 mg/dL — AB (ref 65–99)
POTASSIUM: 3.4 mmol/L — AB (ref 3.5–5.1)
Sodium: 128 mmol/L — ABNORMAL LOW (ref 135–145)

## 2016-07-30 LAB — PROTIME-INR
INR: 0.99
PROTHROMBIN TIME: 13.1 s (ref 11.4–15.2)

## 2016-07-30 MED ORDER — ACETAMINOPHEN 160 MG/5ML PO SOLN: 160.0000 mg | Freq: Four times a day (QID) | ORAL | Status: DC | PRN

## 2016-07-30 MED ORDER — JEVITY 1.5 CAL PO LIQD: 1000.0000 mL | ORAL | Status: DC

## 2016-07-30 MED ORDER — SODIUM CHLORIDE 0.9% FLUSH: 3.0000 mL | INTRAVENOUS | Status: DC | PRN

## 2016-07-30 MED ORDER — MORPHINE SULFATE (CONCENTRATE) 10 MG/0.5ML PO SOLN
5.0000 mg | ORAL | Status: DC | PRN
  Administered 2016-07-30: 5 mg via ORAL
  Filled 2016-07-30: qty 0.5

## 2016-07-30 MED ORDER — JEVITY 1.5 CAL/FIBER PO LIQD
1000.0000 mL | ORAL | Status: DC
  Administered 2016-07-30: 1000 mL
  Filled 2016-07-30 (×2): qty 1000

## 2016-07-30 MED ORDER — GUAIFENESIN 100 MG/5ML PO SYRP
200.0000 mg | ORAL_SOLUTION | Freq: Three times a day (TID) | ORAL | Status: DC | PRN
  Filled 2016-07-30: qty 10

## 2016-07-30 MED ORDER — SENNOSIDES 8.8 MG/5ML PO SYRP
5.0000 mL | ORAL_SOLUTION | Freq: Every day | ORAL | Status: DC | PRN
  Filled 2016-07-30: qty 5

## 2016-07-30 MED ORDER — SODIUM CHLORIDE 0.9 % IV SOLN: INTRAVENOUS | Status: DC

## 2016-07-30 MED ORDER — SODIUM CHLORIDE 0.9% FLUSH: 3.0000 mL | Freq: Two times a day (BID) | INTRAVENOUS | Status: DC

## 2016-07-30 MED ORDER — SODIUM CHLORIDE 0.9 % IV SOLN: 250.0000 mL | INTRAVENOUS | Status: DC | PRN

## 2016-07-30 MED ORDER — HYDROGEN PEROXIDE 3 % EX SOLN
CUTANEOUS | Status: AC
  Filled 2016-07-30: qty 473

## 2016-07-30 MED ORDER — TRAZODONE HCL 50 MG PO TABS: 50.0000 mg | ORAL_TABLET | Freq: Every day | ORAL | Status: DC

## 2016-07-30 MED ORDER — SODIUM CHLORIDE 0.9 % IV BOLUS (SEPSIS)
1000.0000 mL | Freq: Once | INTRAVENOUS | Status: AC
  Administered 2016-07-30: 1000 mL via INTRAVENOUS

## 2016-07-30 MED ORDER — FUROSEMIDE 20 MG PO TABS: 20.0000 mg | ORAL_TABLET | Freq: Every day | ORAL | Status: DC | PRN

## 2016-07-30 NOTE — Clinical Social Work Note (Signed)
CSW received referral stating pt has hospice services at home. Pt does not want residential hospice. Will notify CM of home hospice. CSW will sign off, but can be reconsulted if needed.  Benay Pike, [place]

## 2016-07-30 NOTE — Care Management Note (Signed)
Case Management Note  Patient Details  Name: Randy Blackwell MRN: [id number] Date of Birth: [birth year]   Additional Comments: Patient from home, active with [facility]. Spoke with Otila Kluver, hospice RN, who graciously came to see patient. They have been talking with patient about transferring to Hospice facility, patient is unsure if he wants to go to facility, at this time patient adamantly insists that he go home. He does not want EMS transport. Sister in law will be taking him home. Otila Kluver, Hospice RN will be following up with patient tomorrow. Per Otila Kluver, patient has morphine and Lorazepam at home. Will sign off.  Shannell Mikkelsen, Chauncey Reading, RN 07/30/2016, 1:31 PM

## 2016-07-30 NOTE — ED Triage Notes (Signed)
Pt has throat cancer with trach in place.  Pt has been having bright red blood sputum from trach approx 1 hour ago.

## 2016-07-30 NOTE — ED Provider Notes (Signed)
[facility] DEPT Provider Note   CSN: [id number] Arrival date & time: 07/30/16  [id number]  First Provider Contact:  First MD Initiated Contact with Patient 07/30/16 0320        History   Chief Complaint Chief Complaint  Patient presents with  . Hemoptysis    HPI Randy Blackwell is a 65 y.o. male.  Patient with history of laryngeal cancer status post tracheostomy placed 3 weeks [facility]. Comes in [place] with blood coming from his trach and mouth and coughing up blood. Similar presentation on July 21. Patient is apparently in home hospice and not receiving any chemotherapy or radiation. He is not on any blood thinners. States she's been coughing up blood intermittently since his last discharge but is more severe tonight. Denies any difficulty breathing or swallowing. No chest pain. No nausea or vomiting. Has G-tube in place. No fever.   The history is provided by the patient, a relative and the EMS personnel.    Past Medical History:  Diagnosis Date  . Alcohol abuse   . Bronchiectasis ([place])   . Cancer (HCC)    throat  . COPD (chronic obstructive pulmonary disease) ([place])   . GERD (gastroesophageal reflux disease)   . Hypertension    per PCP notes - pt states he's never been prescribed medication for this  . Malignant tumor hypopharynx ([place])   . Seizures ([place])    per PCP notes - pt states he's not had a seizure for over a year or more (as of 05/21/16.  [name] Shortness of breath dyspnea     Patient Active Problem List   Diagnosis Date Noted  . 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 DNR (do not resuscitate) 07/30/2016  . Hospice care patient 07/30/2016  . Hemoptysis 07/17/2016  . Hypokalemia 07/17/2016  . Hyponatremia 07/17/2016  . Anemia 07/17/2016  . Carcinoma of hypopharynx ([place]) 05/22/2016  . Loss of weight 04/30/2016  . Dysphagia 04/30/2016    Past Surgical History:  Procedure Laterality Date  . COLONOSCOPY WITH PROPOFOL N/A 05/05/2016   Procedure:  COLONOSCOPY WITH PROPOFOL;  Surgeon: Danie Binder, MD;  [facility];  Service: Endoscopy;  Laterality: N/A;  0730  . DIRECT LARYNGOSCOPY  05/22/2016  . DIRECT LARYNGOSCOPY N/A 05/22/2016   Procedure: DIRECT LARYNGOSCOPY;  Surgeon: Melida Quitter, MD;  Location: [facility];  Service: ENT;  Laterality: N/A;  direct laryngoscopy with biopsy  . ESOPHAGOGASTRODUODENOSCOPY (EGD) WITH PROPOFOL N/A 05/05/2016   Procedure: ESOPHAGOGASTRODUODENOSCOPY (EGD) WITH PROPOFOL;  Surgeon: Danie Binder, MD;  [facility];  Service: Endoscopy;  Laterality: N/A;  . ESOPHAGOSCOPY N/A 05/22/2016   Procedure: ESOPHAGOSCOPY;  Surgeon: Melida Quitter, MD;  Location: [facility];  Service: ENT;  Laterality: N/A;  . None    . POLYPECTOMY  05/05/2016   Procedure: POLYPECTOMY;  Surgeon: Danie Binder, MD;  [facility];  Service: Endoscopy;;  cecal polyp cold bx,  . TRACHEOSTOMY         Home Medications    Prior to Admission medications   Medication Sig Start Date End Date Taking? Authorizing Provider  acetaminophen (TYLENOL) 160 MG/5ML solution Take 160 mg by mouth every 6 (six) hours as needed for mild pain, moderate pain or fever.    Historical Provider, MD  acetaminophen (TYLENOL) 650 MG suppository Place 650 mg rectally every 4 (four) hours as needed. For pain    Historical Provider, MD  furosemide (LASIX) 20 MG tablet Take 20 mg by mouth daily as  needed for fluid or edema.     Historical Provider, MD  guaifenesin (ROBITUSSIN) 100 MG/5ML syrup Take 200 mg by mouth 3 (three) times daily as needed for cough.    Historical Provider, MD  haloperidol (HALDOL) 2 MG/ML solution Take 1 mg by mouth every 6 (six) hours as needed.    Historical Provider, MD  hyoscyamine (LEVSIN SL) 0.125 MG SL tablet Take 0.125 mg by mouth every 6 (six) hours as needed.    Historical Provider, MD  LORazepam (ATIVAN) 0.5 MG tablet Take 0.5-1 mg by mouth every 4 (four) hours as needed.     Historical Provider, MD  Morphine  Sulfate (MORPHINE CONCENTRATE) 10 mg / 0.5 ml concentrated solution Take 5 mg by mouth every hour as needed. For pain    Historical Provider, MD  Nutritional Supplements (FEEDING SUPPLEMENT, JEVITY 1.5 CAL,) LIQD Place 1,000 mLs into feeding tube daily.    Historical Provider, MD  sennosides (SENEXON) 8.8 MG/5ML syrup Take 5 mLs by mouth daily as needed for mild constipation.    Historical Provider, MD  traZODone (DESYREL) 50 MG tablet Take 50 mg by mouth at bedtime.    Historical Provider, MD    Family History Family History  Problem Relation Age of Onset  . Diabetes Mother   . Dementia Father   . Seizures Father   . Diabetes Father   . Hypertension Father   . Colon cancer Neg Hx   . Pancreatic cancer Neg Hx   . Stomach cancer Neg Hx     Social History Social History  Substance Use Topics  . Smoking status: Current Every Day Smoker    Packs/day: 1.00    Years: 50.00    Types: Cigarettes  . Smokeless tobacco: Never Used  . Alcohol use 7.2 oz/week    12 Standard drinks or equivalent per week     Comment: wine and beer. Drinks about a 6 pack of beer a day. About a 1/5 or more of wine a day.      Allergies   Review of patient's allergies indicates no known allergies.   Review of Systems Review of Systems  Constitutional: Negative for activity change, appetite change and fever.  HENT: Positive for postnasal drip.   Respiratory: Positive for cough. Negative for chest tightness and shortness of breath.   Gastrointestinal: Negative for abdominal pain and nausea.  Genitourinary: Negative for dysuria, hematuria and urgency.  Musculoskeletal: Negative for arthralgias and myalgias.  Neurological: Negative for dizziness, weakness and light-headedness.  A complete 10 system review of systems was obtained and all systems are negative except as noted in the HPI and PMH.     Physical Exam Updated Vital Signs BP 96/71   Pulse 72   Temp 98.2 F (36.8 C) (Oral)   Resp 16   Ht 5'  11" (1.803 m)   Wt 88 lb (39.9 kg)   SpO2 100%   BMI 12.27 kg/m   Physical Exam  Constitutional: He is oriented to person, place, and time. No distress.  cachectic  HENT:  Head: Normocephalic and atraumatic.  Mouth/Throat: Oropharynx is clear and moist. No oropharyngeal exudate.  Trach in place. Some bright red blood from trach without active bleeding Red blood in mouth and on tongue  Eyes: Conjunctivae and EOM are normal. Pupils are equal, round, and reactive to light.  Neck: Normal range of motion. Neck supple.  No meningismus.  Cardiovascular: Normal rate, regular rhythm, normal heart sounds and intact distal pulses.  No murmur heard. Pulmonary/Chest: Effort normal and breath sounds normal. No respiratory distress.  Abdominal: Soft. There is no tenderness. There is no rebound and no guarding.  Musculoskeletal: Normal range of motion. He exhibits no edema or tenderness.  Neurological: He is alert and oriented to person, place, and time. No cranial nerve deficit. He exhibits normal muscle tone. Coordination normal.  No ataxia on finger to nose bilaterally. No pronator drift. 5/5 strength throughout. CN 2-12 intact.Equal grip strength. Sensation intact.   Skin: Skin is warm.  Psychiatric: He has a normal mood and affect. His behavior is normal.  Nursing note and vitals reviewed.    ED Treatments / Results  Labs (all labs ordered are listed, but only abnormal results are displayed) Labs Reviewed  CBC WITH DIFFERENTIAL/PLATELET - Abnormal; Notable for the following:       Result Value   RBC 3.56 (*)    Hemoglobin 10.7 (*)    HCT 31.3 (*)    Platelets 438 (*)    All other components within normal limits  BASIC METABOLIC PANEL - Abnormal; Notable for the following:    Sodium 128 (*)    Potassium 3.4 (*)    Chloride 79 (*)    CO2 44 (*)    Glucose, Bld 193 (*)    BUN 25 (*)    Calcium 8.8 (*)    All other components within normal limits  PROTIME-INR    EKG  EKG  Interpretation  Date/Time:  Thursday July 30 2016 03:05:57 EDT Ventricular Rate:  86 PR Interval:    QRS Duration: 99 QT Interval:  395 QTC Calculation: 473 R Axis:   85 Text Interpretation:  Sinus rhythm Atrial premature complex Biatrial enlargement Anteroseptal infarct, age indeterminate Abnrm T, consider ischemia, anterolateral lds Baseline wander in lead(s) V2 V4 Nonspecific ST abnormality Confirmed by Wyvonnia Dusky  MD, Tye Vigo [phone]) on 07/30/2016 3:38:08 AM       Radiology Dg Chest Portable 1 View  Result Date: 07/30/2016 CLINICAL DATA:  Bleeding from tracheostomy for 1 hour. EXAM: PORTABLE CHEST 1 VIEW COMPARISON:  Chest CT 07/17/2016 FINDINGS: Tracheostomy tube at the thoracic inlet. Lung emphysema with right lower lobe scarring, bronchiectasis on CT. Normal heart size and mediastinal contours. No pulmonary edema, focal airspace opacity, pleural effusion or pneumothorax. Stable osseous structures. IMPRESSION: Unchanged position of tracheostomy tube at the thoracic inlet. Emphysema and right lower lobe scarring. Electronically Signed   By: Jeb Levering M.D.   On: 07/30/2016 03:25    Procedures Procedures (including critical care time)  Medications Ordered in ED Medications  hydrogen peroxide 3 % external solution (  Not Given 07/30/16 0300)  furosemide (LASIX) tablet 20 mg (not administered)  morphine CONCENTRATE 10 MG/0.5ML oral solution 5 mg (not administered)  traZODone (DESYREL) tablet 50 mg (not administered)  guaifenesin (ROBITUSSIN) 100 MG/5ML syrup 200 mg (not administered)  acetaminophen (TYLENOL) solution 160 mg (not administered)  sennosides (SENOKOT) 8.8 MG/5ML syrup 5 mL (not administered)  sodium chloride flush (NS) 0.9 % injection 3 mL (not administered)  sodium chloride flush (NS) 0.9 % injection 3 mL (not administered)  0.9 %  sodium chloride infusion (not administered)  feeding supplement (JEVITY 1.5 CAL/FIBER) liquid 1,000 mL (not administered)  sodium  chloride 0.9 % bolus 1,000 mL (0 mLs Intravenous Stopped 07/30/16 0424)     Initial Impression / Assessment and Plan / ED Course  I have reviewed the triage vital signs and the nursing notes.  Pertinent labs & imaging results that  were available during my care of the patient were reviewed by me and considered in my medical decision making (see chart for details).  Clinical Course   Patient with hemoptysis and bleeding from tracheostomy. Vital stable, no distress. Similar presentation July 21. Patient is in home hospice.  Patient is suctioned on arrival. Lurline Idol appears to be in place.  Per Dr. Constance Holster ENT last consult note in July " Based on the findings, the bleeding is coming from the tumor, not from the tracheostomy site itself. I discussed with the patient and 3 family members 2 options. One option is to do nothing and continue to monitor. The second option is to have him evaluated by interventional radiology to see if a feeding vessel or vessels can be identified and occluded. He is not very interested in pursuing this right now. He would also like to go home. I think this is reasonable given the fact that he is terminally ill and currently in hospice care. If he changes his mind we can have him evaluated by interventional radiology. Contact me if needed."  Hemoglobin is stable. Bleeding has improved. Mild hyponatremia and IVF given. Patient continues to state he doesn't want any treatment for his cancer or if bleeding from his trach should worsen.  D/w Dr. Shanon Brow who will admit to observation. No indication for transfer for ENT evaluation as patient is not interested in options given by Dr. Constance Holster above.   Final Clinical Impressions(s) / ED Diagnoses   Final diagnoses:  Tracheostomy hemorrhage [facility])    New Prescriptions Current Discharge Medication List       Ezequiel Essex, MD 07/30/16 [phone]

## 2016-07-30 NOTE — Progress Notes (Signed)
1506 Patient requested to leave AMA. Dr.Le notified and made aware. Patient is returning home with hospice. AMA paper signed by patient and placed in patient chart.

## 2016-07-30 NOTE — ED Triage Notes (Signed)
Pt c/o intermittent bleeding from trach.

## 2016-07-30 NOTE — Progress Notes (Signed)
Emergency trach and trach cleaning equipment at bedside with ambu bag . Pt setup on 28% trach collar.

## 2016-07-30 NOTE — Discharge Summary (Signed)
Physician Discharge Summary  Randy Blackwell [id number] DOB: 1951-10-08 DOA: 07/30/2016  PCP: [name], MD  Admit date: 07/30/2016 Discharge date: 07/30/2016  Time spent: 35 minutes  Recommendations for Outpatient Follow-up:  1.  Follow up with PCP and hospice RN at home.    Discharge Diagnoses:  Principal Problem:   Tracheostomy hemorrhage ([place]) Active Problems:   Carcinoma of hypopharynx ([place])   Hemoptysis   DNR (do not resuscitate)   Hospice care patient   Discharge Condition: bleeding has improved.   Diet recommendation:  TF.   Filed Weights   07/30/16 0245  Weight: 39.9 kg (88 lb)    History of present illness: Patient was admitted for bleeding from trach, resulting from recurrent laryngeal cancer, admitted by Dr Shanon Brow on Aug 3, 17.  As per her H and P:  "  Randy Blackwell is a 65 y.o. 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  Pt is under hospice care at home, and lives alone.  His niece is also with him.  Pt has decided not to receive any care for his cancer.  He has a h/o of bleeding before and was evaluated by ENT at [facility] and pt refused all care at that time.  He continues to not want anything done, even in the setting of active bleeding.  He wants no procedure.  His niece called hospice who advised him to come to the ED.  I have asked if he would possibly want to be placed in a hospice house and he says no.  He again wants nothing done about his bleeding.  He does not want to be transferred to [facility] either.  His niece is present and understands his wishes.  Pt is referred for admission for bleeding from his trach.   Hospital Course:  Patient was admitted, but he is not interested in any Tx, and it was simply to observe him.  His bleeding improved.  It was felt that his family called EMS for his bleeding, as he has already been with hopice care.  Patient, however, did not want  residential Hospice, and he only wanted to go home.  He couldn't wait to be discharged, so he signed out AMA.     Consultations:  None.   Discharge Exam: Vitals:   07/30/16 0400 07/30/16 0433  BP: 100/66 96/71  Pulse:  72  Resp: 14 16  Temp:      Discharge Medication List as of 07/30/2016  3:15 PM    CONTINUE these medications which have NOT CHANGED   Details  acetaminophen (TYLENOL) 160 MG/5ML solution Take 480-960 mg by mouth every 6 (six) hours as needed for mild pain, moderate pain or fever. , Historical Med    acetaminophen (TYLENOL) 650 MG suppository Place 650 mg rectally every 4 (four) hours as needed. For pain, Historical Med    furosemide (LASIX) 20 MG tablet Take 20 mg by mouth daily as needed for fluid or edema. , Historical Med    guaifenesin (ROBITUSSIN) 100 MG/5ML syrup Place 200-300 mg into feeding tube 3 (three) times daily as needed for cough. , Historical Med    haloperidol (HALDOL) 2 MG/ML solution Take 1 mg by mouth every 6 (six) hours as needed., Historical Med    hyoscyamine (LEVSIN SL) 0.125 MG SL tablet Take 0.125 mg by mouth every 6 (six) hours as needed., Historical Med    LORazepam (ATIVAN) 0.5  MG tablet Take 0.5-1 mg by mouth daily as needed for anxiety. , Historical Med    LORazepam (ATIVAN) 2 MG/ML concentrated solution Take 0.5-1 mg by mouth every 4 (four) hours as needed for anxiety (shortness of breath)., Historical Med    morphine (ROXANOL) 20 MG/ML concentrated solution Take 10-20 mg by mouth every 2 (two) hours as needed for moderate pain or shortness of breath., Historical Med    Nutritional Supplements (FEEDING SUPPLEMENT, JEVITY 1.5 CAL,) LIQD Place 1,000 mLs into feeding tube daily., Historical Med    sennosides (SENEXON) 8.8 MG/5ML syrup Take 10-15 mLs by mouth daily as needed for mild constipation. 10 ml daily and may take another 5 ml as needed with max dose of 15 mls daily., Historical Med    sorbitol 70 % solution Take 30 mLs by  mouth daily as needed (constipation)., Historical Med    traZODone (DESYREL) 50 MG tablet Place 50 mg into feeding tube at bedtime. , Historical Med       No Known Allergies    The results of significant diagnostics from this hospitalization (including imaging, microbiology, ancillary and laboratory) are listed below for reference.    Significant Diagnostic Studies: Ct Soft Tissue Neck W Contrast  Result Date: 07/17/2016 CLINICAL DATA:  Tracheostomy dislodged earlier today, reinserted by the patient. Coughing up blood since then. Known pharyngeal mass. EXAM: CT NECK WITH CONTRAST TECHNIQUE: Multidetector CT imaging of the neck was performed using the standard protocol following the bolus administration of intravenous contrast. CONTRAST:  190mL ISOVUE-300 IOPAMIDOL (ISOVUE-300) INJECTION 61% COMPARISON:  CT 06/03/2016 FINDINGS: Tracheostomy tube appears grossly appropriately positioned. No soft tissue complications seen at the tracheostomy site. Large hypo pharyngeal to supraglottic mass remains evident as seen previously. Diameter of this lesion is at least 5.7 cm. Pronounced mass effect upon the hypopharynx in the upper airway. No evidence of vascular occlusion. There is soft tissue edema in the region probably secondary to radiation. Chronic spondylosis as seen previously. IMPRESSION: Tracheostomy appears to be repositioned without complication and appears grossly well positioned. Large right hypo pharyngeal to supraglottic mass measuring up to 5.7 cm in diameter as seen previously. Regional edema probably relates to radiation. No qualitative change since the previous study. Electronically Signed   By: Nelson Chimes M.D.   On: 07/17/2016 20:14   Ct Chest W Contrast  Result Date: 07/17/2016 CLINICAL DATA:  Bleeding from tracheostomy site. Known pharyngeal mass. EXAM: CT CHEST WITH CONTRAST TECHNIQUE: Multidetector CT imaging of the chest was performed during intravenous contrast administration.  CONTRAST:  187mL ISOVUE-300 IOPAMIDOL (ISOVUE-300) INJECTION 61% COMPARISON:  CT of the chest April 10, 2016 and CT of the neck June 03, 2016 FINDINGS: The patient's tracheostomy tube is in good position. A small amount of fluid is seen posteriorly in the trachea, probably mucus. The central airways are otherwise normal. Mild scarring is seen in the lung apices. Emphysematous changes are seen in the lungs. Bronchiectasis remains in the right lower lobe, not significantly changed. Just distal and posterior to the bronchiectasis is more focal opacity, unchanged, likely scarring. No suspicious pulmonary nodules or masses. Mild ground-glass in the right middle lobe and central right lower lobe is likely infectious or inflammatory process. No other focal infiltrate. The patient has known large pharyngeal mass is again identified extending from the hypopharynx to below the level the cricoid posteriorly. The mass appears to extend more inferiorly within the larynx. A full description of the known mass will be made on the CT of  the neck. A few prominent nodes are seen in the right neck. No effusions. No adenopathy is seen within the chest. The thoracic aorta is non aneurysmal with scattered atherosclerotic change. The heart is unchanged. There are coronary artery calcifications. Evaluation of the central pulmonary arteries is unremarkable. Evaluation of the upper abdomen is limited but unchanged and unremarkable. No acute bony abnormalities. IMPRESSION: 1. The patient's known pharyngeal mass, extending into the larynx, may have progressed in the interval. However, the mass will be more fully described on the CT of the neck. 2. Bronchiectasis in the right lower lobe with adjacent soft tissue opacity, likely scarring, is unchanged. 3. No other acute abnormalities in the chest. Electronically Signed   By: Dorise Bullion III M.D   On: 07/17/2016 20:17   Dg Chest Portable 1 View  Result Date: 07/30/2016 CLINICAL DATA:   Bleeding from tracheostomy for 1 hour. EXAM: PORTABLE CHEST 1 VIEW COMPARISON:  Chest CT 07/17/2016 FINDINGS: Tracheostomy tube at the thoracic inlet. Lung emphysema with right lower lobe scarring, bronchiectasis on CT. Normal heart size and mediastinal contours. No pulmonary edema, focal airspace opacity, pleural effusion or pneumothorax. Stable osseous structures. IMPRESSION: Unchanged position of tracheostomy tube at the thoracic inlet. Emphysema and right lower lobe scarring. Electronically Signed   By: Jeb Levering M.D.   On: 07/30/2016 03:25    Microbiology: No results found for this or any previous visit (from the past 240 hour(s)).   Labs: Basic Metabolic Panel:  Recent Labs Lab 07/30/16 0303  NA 128*  K 3.4*  CL 79*  CO2 44*  GLUCOSE 193*  BUN 25*  CREATININE 1.06  CALCIUM 8.8*   CBC:  Recent Labs Lab 07/30/16 0303  WBC 7.5  NEUTROABS 5.8  HGB 10.7*  HCT 31.3*  MCV 87.9  PLT 438*    Signed:  Shantaya Bluestone MD. Rosalita Chessman.  [facility] 07/30/2016, 6:26 PM

## 2016-07-30 NOTE — H&P (Signed)
History and Physical    Randy Blackwell [id number] DOB: May 19, 1951 DOA: 07/30/2016  PCP: [name], MD  Patient coming from:  Home, sent in by hospice  Chief Complaint:  Trach bleeding  HPI: Randy Blackwell is a 65 y.o. 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  Pt is under hospice care at home, and lives alone.  His niece is also with him.  Pt has decided not to receive any care for his cancer.  He has a h/o of bleeding before and was evaluated by ENT at [name] and pt refused all care at that time.  He continues to not want anything done, even in the setting of active bleeding.  He wants no procedure.  His niece called hospice who advised him to come to the ED.  I have asked if he would possibly want to be placed in a hospice house and he says no.  He again wants nothing done about his bleeding.  He does not want to be transferred to [facility] either.  His niece is present and understands his wishes.  Pt is referred for admission for bleeding from his trach.  Review of Systems: As per HPI otherwise 10 point review of systems negative.   Past Medical History:  Diagnosis Date  . Alcohol abuse   . Bronchiectasis ([place])   . Cancer (HCC)    throat  . COPD (chronic obstructive pulmonary disease) ([place])   . GERD (gastroesophageal reflux disease)   . Hypertension    per PCP notes - pt states he's never been prescribed medication for this  . 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 Seizures ([place])    per PCP notes - pt states he's not had a seizure for over a year or more (as of 05/21/16.  [name] Shortness of breath dyspnea     Past Surgical History:  Procedure Laterality Date  . COLONOSCOPY WITH PROPOFOL N/A 05/05/2016   Procedure: COLONOSCOPY WITH PROPOFOL;  Surgeon: Danie Binder, MD;  [facility];  Service: Endoscopy;  Laterality: N/A;  0730  . DIRECT LARYNGOSCOPY  05/22/2016  . DIRECT  LARYNGOSCOPY N/A 05/22/2016   Procedure: DIRECT LARYNGOSCOPY;  Surgeon: Melida Quitter, MD;  Location: [facility];  Service: ENT;  Laterality: N/A;  direct laryngoscopy with biopsy  . ESOPHAGOGASTRODUODENOSCOPY (EGD) WITH PROPOFOL N/A 05/05/2016   Procedure: ESOPHAGOGASTRODUODENOSCOPY (EGD) WITH PROPOFOL;  Surgeon: Danie Binder, MD;  [facility];  Service: Endoscopy;  Laterality: N/A;  . ESOPHAGOSCOPY N/A 05/22/2016   Procedure: ESOPHAGOSCOPY;  Surgeon: Melida Quitter, MD;  Location: [place];  Service: ENT;  Laterality: N/A;  . None    . POLYPECTOMY  05/05/2016   Procedure: POLYPECTOMY;  Surgeon: Danie Binder, MD;  [facility];  Service: Endoscopy;;  cecal polyp cold bx,  . TRACHEOSTOMY       reports that he has been smoking Cigarettes.  He has a 50.00 pack-year smoking history. He has never used smokeless tobacco. He reports that he drinks about 7.2 oz of alcohol per week . He reports that he uses drugs, including Marijuana.  No Known Allergies  Family History  Problem Relation Age of Onset  . Diabetes Mother   . Dementia Father   . Seizures Father   . Diabetes Father   . Hypertension Father   . Colon cancer Neg Hx   . Pancreatic cancer Neg Hx   .  Stomach cancer Neg Hx     Prior to Admission medications   Medication Sig Start Date End Date Taking? Authorizing Provider  acetaminophen (TYLENOL) 160 MG/5ML solution Take 160 mg by mouth every 6 (six) hours as needed for mild pain, moderate pain or fever.    Historical Provider, MD  acetaminophen (TYLENOL) 650 MG suppository Place 650 mg rectally every 4 (four) hours as needed. For pain    Historical Provider, MD  furosemide (LASIX) 20 MG tablet Take 20 mg by mouth daily as needed for fluid or edema.     Historical Provider, MD  guaifenesin (ROBITUSSIN) 100 MG/5ML syrup Take 200 mg by mouth 3 (three) times daily as needed for cough.    Historical Provider, MD  haloperidol (HALDOL) 2 MG/ML solution Take 1 mg by mouth every 6  (six) hours as needed.    Historical Provider, MD  hyoscyamine (LEVSIN SL) 0.125 MG SL tablet Take 0.125 mg by mouth every 6 (six) hours as needed.    Historical Provider, MD  LORazepam (ATIVAN) 0.5 MG tablet Take 0.5-1 mg by mouth every 4 (four) hours as needed.     Historical Provider, MD  Morphine Sulfate (MORPHINE CONCENTRATE) 10 mg / 0.5 ml concentrated solution Take 5 mg by mouth every hour as needed. For pain    Historical Provider, MD  Nutritional Supplements (FEEDING SUPPLEMENT, JEVITY 1.5 CAL,) LIQD Place 1,000 mLs into feeding tube daily.    Historical Provider, MD  sennosides (SENEXON) 8.8 MG/5ML syrup Take 5 mLs by mouth daily as needed for mild constipation.    Historical Provider, MD  traZODone (DESYREL) 50 MG tablet Take 50 mg by mouth at bedtime.    Historical Provider, MD    Physical Exam: Vitals:   07/30/16 0330 07/30/16 0400 07/30/16 0433 07/30/16 0443  BP: 96/71 100/66 96/71   Pulse:   72   Resp: 19 14 16    Temp:      TempSrc:      SpO2:   100% 100%  Weight:      Height:          Constitutional: NAD, calm, comfortable Vitals:   07/30/16 0330 07/30/16 0400 07/30/16 0433 07/30/16 0443  BP: 96/71 100/66 96/71   Pulse:   72   Resp: 19 14 16    Temp:      TempSrc:      SpO2:   100% 100%  Weight:      Height:       Eyes: PERRL, lids and conjunctivae normal ENMT: Mucous membranes are moist. Posterior pharynx clear of any exudate or lesions.Normal dentition. Cachectic.  Coughing up blood from his trach. Neck: normal, supple, no masses, no thyromegaly Respiratory: clear to auscultation bilaterally, no wheezing, no crackles. Normal respiratory effort. No accessory muscle use.  Cardiovascular: Regular rate and rhythm, no murmurs / rubs / gallops. No extremity edema. 2+ pedal pulses. No carotid bruits.  Abdomen: no tenderness, no masses palpated. No hepatosplenomegaly. Bowel sounds positive.  Musculoskeletal: no clubbing / cyanosis. No joint deformity upper and lower  extremities. Good ROM, no contractures. Normal muscle tone.  Skin: no rashes, lesions, ulcers. No induration Neurologic: CN 2-12 grossly intact. Sensation intact, DTR normal. Strength 5/5 in all 4.  Psychiatric: Normal judgment and insight. Alert and oriented x 3. Normal mood.    Labs on Admission: I have personally reviewed following labs and imaging studies  CBC:  Recent Labs Lab 07/30/16 0303  WBC 7.5  NEUTROABS 5.8  HGB 10.7*  HCT 31.3*  MCV 87.9  PLT 99991111*   Basic Metabolic Panel:  Recent Labs Lab 07/30/16 0303  NA 128*  K 3.4*  CL 79*  CO2 44*  GLUCOSE 193*  BUN 25*  CREATININE 1.06  CALCIUM 8.8*   GFR: Estimated Creatinine Clearance: 40.8 mL/min (by C-G formula based on SCr of 1.06 mg/dL). Coagulation Profile:  Recent Labs Lab 07/30/16 0303  INR 0.99   Urine analysis:    Component Value Date/Time   COLORURINE YELLOW 04/23/2016 1313   APPEARANCEUR HAZY (A) 04/23/2016 1313   LABSPEC 1.025 04/23/2016 1313   PHURINE 6.0 04/23/2016 1313   GLUCOSEU NEGATIVE 04/23/2016 1313   HGBUR NEGATIVE 04/23/2016 1313   BILIRUBINUR SMALL (A) 04/23/2016 1313   KETONESUR TRACE (A) 04/23/2016 1313   PROTEINUR 30 (A) 04/23/2016 1313   NITRITE NEGATIVE 04/23/2016 1313   LEUKOCYTESUR MODERATE (A) 04/23/2016 1313   Sepsis Labs: !!!!!!!!!!!!!!!!!!!!!!!!!!!!!!!!!!!!!!!!!!!! @LABRCNTIP (procalcitonin:4,lacticidven:4) )No results found for this or any previous visit (from the past 240 hour(s)).   Radiological Exams on Admission: Dg Chest Portable 1 View  Result Date: 07/30/2016 CLINICAL DATA:  Bleeding from tracheostomy for 1 hour. EXAM: PORTABLE CHEST 1 VIEW COMPARISON:  Chest CT 07/17/2016 FINDINGS: Tracheostomy tube at the thoracic inlet. Lung emphysema with right lower lobe scarring, bronchiectasis on CT. Normal heart size and mediastinal contours. No pulmonary edema, focal airspace opacity, pleural effusion or pneumothorax. Stable osseous structures. IMPRESSION: Unchanged  position of tracheostomy tube at the thoracic inlet. Emphysema and right lower lobe scarring. Electronically Signed   By: Jeb Levering M.D.   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  This is likely coming from his mass.  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  Consult SW for options in the am.  Pt really doesn't want to go to a hospice house.  Would contact his hospice agency in the am.  Yannis Broce A MD [facility]  If 7PM-7AM, please contact night-coverage [email or web address] Password [email or web address]  07/30/2016, 4:50 AM

## 2016-08-02 ENCOUNTER — Other Ambulatory Visit: Payer: Self-pay

## 2016-08-02 ENCOUNTER — Encounter (HOSPITAL_COMMUNITY): Payer: Self-pay

## 2016-08-02 ENCOUNTER — Inpatient Hospital Stay (HOSPITAL_COMMUNITY)
Admission: EM | Admit: 2016-08-02 | Discharge: 2016-08-04 | DRG: 917 | Disposition: A | Discharge status: Hospice | Attending: Internal Medicine | Admitting: Internal Medicine

## 2016-08-02 DIAGNOSIS — Z515 Encounter for palliative care: Secondary | ICD-10-CM | POA: Diagnosis present

## 2016-08-02 DIAGNOSIS — R4182 Altered mental status, unspecified: Secondary | ICD-10-CM | POA: Diagnosis present

## 2016-08-02 DIAGNOSIS — T510X1A Toxic effect of ethanol, accidental (unintentional), initial encounter: Secondary | ICD-10-CM | POA: Diagnosis present

## 2016-08-02 DIAGNOSIS — C139 Malignant neoplasm of hypopharynx, unspecified: Secondary | ICD-10-CM | POA: Diagnosis present

## 2016-08-02 DIAGNOSIS — F17210 Nicotine dependence, cigarettes, uncomplicated: Secondary | ICD-10-CM | POA: Diagnosis present

## 2016-08-02 DIAGNOSIS — G92 Toxic encephalopathy: Secondary | ICD-10-CM | POA: Diagnosis present

## 2016-08-02 DIAGNOSIS — I10 Essential (primary) hypertension: Secondary | ICD-10-CM | POA: Diagnosis present

## 2016-08-02 DIAGNOSIS — E876 Hypokalemia: Secondary | ICD-10-CM | POA: Diagnosis present

## 2016-08-02 DIAGNOSIS — K219 Gastro-esophageal reflux disease without esophagitis: Secondary | ICD-10-CM | POA: Diagnosis present

## 2016-08-02 DIAGNOSIS — Z7289 Other problems related to lifestyle: Secondary | ICD-10-CM | POA: Diagnosis present

## 2016-08-02 DIAGNOSIS — T402X1A Poisoning by other opioids, accidental (unintentional), initial encounter: Principal | ICD-10-CM | POA: Diagnosis present

## 2016-08-02 DIAGNOSIS — J449 Chronic obstructive pulmonary disease, unspecified: Secondary | ICD-10-CM | POA: Diagnosis present

## 2016-08-02 DIAGNOSIS — Z66 Do not resuscitate: Secondary | ICD-10-CM | POA: Diagnosis present

## 2016-08-02 DIAGNOSIS — T424X1A Poisoning by benzodiazepines, accidental (unintentional), initial encounter: Secondary | ICD-10-CM | POA: Diagnosis present

## 2016-08-02 DIAGNOSIS — Z833 Family history of diabetes mellitus: Secondary | ICD-10-CM

## 2016-08-02 DIAGNOSIS — R5383 Other fatigue: Secondary | ICD-10-CM

## 2016-08-02 DIAGNOSIS — Z789 Other specified health status: Secondary | ICD-10-CM | POA: Diagnosis present

## 2016-08-02 DIAGNOSIS — Z930 Tracheostomy status: Secondary | ICD-10-CM

## 2016-08-02 DIAGNOSIS — Z8249 Family history of ischemic heart disease and other diseases of the circulatory system: Secondary | ICD-10-CM

## 2016-08-02 LAB — COMPREHENSIVE METABOLIC PANEL
ALT: 9 U/L — AB (ref 17–63)
AST: 18 U/L (ref 15–41)
Albumin: 2.8 g/dL — ABNORMAL LOW (ref 3.5–5.0)
Alkaline Phosphatase: 73 U/L (ref 38–126)
Anion gap: 6 (ref 5–15)
BILIRUBIN TOTAL: 0.6 mg/dL (ref 0.3–1.2)
BUN: 14 mg/dL (ref 6–20)
CALCIUM: 8.5 mg/dL — AB (ref 8.9–10.3)
CO2: 40 mmol/L — ABNORMAL HIGH (ref 22–32)
CREATININE: 0.7 mg/dL (ref 0.61–1.24)
Chloride: 84 mmol/L — ABNORMAL LOW (ref 101–111)
GFR calc Af Amer: >60 mL/min (ref >60)
Glucose, Bld: 113 mg/dL — ABNORMAL HIGH (ref 65–99)
POTASSIUM: 2.8 mmol/L — AB (ref 3.5–5.1)
Sodium: 130 mmol/L — ABNORMAL LOW (ref 135–145)
TOTAL PROTEIN: 6.5 g/dL (ref 6.5–8.1)

## 2016-08-02 LAB — CBC WITH DIFFERENTIAL/PLATELET
BASOS PCT: 1 %
Basophils Absolute: 0 10*3/uL (ref 0.0–0.1)
EOS ABS: 0.1 10*3/uL (ref 0.0–0.7)
Eosinophils Relative: 2 %
HCT: 26.3 % — ABNORMAL LOW (ref 39.0–52.0)
HEMOGLOBIN: 9.1 g/dL — AB (ref 13.0–17.0)
Lymphocytes Relative: 15 %
Lymphs Abs: 1.3 10*3/uL (ref 0.7–4.0)
MCH: 30.6 pg (ref 26.0–34.0)
MCHC: 34.6 g/dL (ref 30.0–36.0)
MCV: 88.6 fL (ref 78.0–100.0)
MONO ABS: 0.9 10*3/uL (ref 0.1–1.0)
MONOS PCT: 11 %
NEUTROS PCT: 71 %
Neutro Abs: 5.9 10*3/uL (ref 1.7–7.7)
Platelets: 338 10*3/uL (ref 150–400)
RBC: 2.97 MIL/uL — ABNORMAL LOW (ref 4.22–5.81)
RDW: 13.3 % (ref 11.5–15.5)
WBC: 8.2 10*3/uL (ref 4.0–10.5)

## 2016-08-02 LAB — ETHANOL: ALCOHOL ETHYL (B): 11 mg/dL — AB (ref <5)

## 2016-08-02 MED ORDER — SODIUM CHLORIDE 0.9 % IV BOLUS (SEPSIS)
500.0000 mL | Freq: Once | INTRAVENOUS | Status: AC
  Administered 2016-08-03: 500 mL via INTRAVENOUS

## 2016-08-02 MED ORDER — POTASSIUM CHLORIDE 10 MEQ/100ML IV SOLN
10.0000 meq | INTRAVENOUS | Status: DC
  Administered 2016-08-03: 10 meq via INTRAVENOUS
  Filled 2016-08-02: qty 100

## 2016-08-02 NOTE — ED Notes (Signed)
Family at bedside, patient is a DNR, yellow DNR now in room

## 2016-08-02 NOTE — ED Provider Notes (Signed)
[facility] DEPT Provider Note   CSN: [id number] Arrival date & time: 08/02/16  1956  First Provider Contact:  First MD Initiated Contact with Patient 08/02/16 2013        History   Chief Complaint Chief Complaint  Patient presents with  . Altered Mental Status    HPI Nikolaos Defauw is a 65 y.o. male.  HPI Level V caveat due to altered mental status. Patient brought in by friend apparently after being found less responsive than normal. Patient lives alone and has a history of tracheal cancer. He is under hospice care. Was recently evaluated 3 days ago for bleeding from his trach. He wanted no intervention at that time. Past Medical History:  Diagnosis Date  . Alcohol abuse   . Bronchiectasis ([place])   . Cancer (HCC)    throat  . COPD (chronic obstructive pulmonary disease) ([place])   . GERD (gastroesophageal reflux disease)   . Hypertension    per PCP notes - pt states he's never been prescribed medication for this  . 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 Seizures ([place])    per PCP notes - pt states he's not had a seizure for over a year or more (as of 05/21/16.  [name] Shortness of breath dyspnea     Patient Active Problem List   Diagnosis Date Noted  . Altered mental status 08/02/2016  . 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 DNR (do not resuscitate) 07/30/2016  . Hospice care patient 07/30/2016  . Hemoptysis 07/17/2016  . Hypokalemia 07/17/2016  . Hyponatremia 07/17/2016  . Anemia 07/17/2016  . 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 Loss of weight 04/30/2016  . Dysphagia 04/30/2016    Past Surgical History:  Procedure Laterality Date  . COLONOSCOPY WITH PROPOFOL N/A 05/05/2016   Procedure: COLONOSCOPY WITH PROPOFOL;  Surgeon: Danie Binder, MD;  [facility];  Service: Endoscopy;  Laterality: N/A;  0730  . DIRECT LARYNGOSCOPY  05/22/2016  . DIRECT LARYNGOSCOPY N/A 05/22/2016   Procedure: DIRECT LARYNGOSCOPY;  Surgeon: Melida Quitter, MD;   Location: [facility];  Service: ENT;  Laterality: N/A;  direct laryngoscopy with biopsy  . ESOPHAGOGASTRODUODENOSCOPY (EGD) WITH PROPOFOL N/A 05/05/2016   Procedure: ESOPHAGOGASTRODUODENOSCOPY (EGD) WITH PROPOFOL;  Surgeon: Danie Binder, MD;  [facility];  Service: Endoscopy;  Laterality: N/A;  . ESOPHAGOSCOPY N/A 05/22/2016   Procedure: ESOPHAGOSCOPY;  Surgeon: Melida Quitter, MD;  Location: [place] [facility];  Service: ENT;  Laterality: N/A;  . None    . POLYPECTOMY  05/05/2016   Procedure: POLYPECTOMY;  Surgeon: Danie Binder, MD;  [facility];  Service: Endoscopy;;  cecal polyp cold bx,  . TRACHEOSTOMY         Home Medications    Prior to Admission medications   Medication Sig Start Date End Date Taking? Authorizing Provider  acetaminophen (TYLENOL) 650 MG suppository Place 650 mg rectally every 4 (four) hours as needed. For pain   Yes Historical Provider, MD  guaifenesin (ROBITUSSIN) 100 MG/5ML syrup Place 200-300 mg into feeding tube 3 (three) times daily as needed for cough.    Yes Historical Provider, MD  haloperidol (HALDOL) 2 MG/ML solution Take 1 mg by mouth every 6 (six) hours as needed.   Yes Historical Provider, MD  hyoscyamine (LEVSIN SL) 0.125 MG SL tablet Take 0.125 mg by mouth every 6 (six) hours as needed.   Yes Historical Provider, MD  LORazepam (ATIVAN) 2 MG/ML concentrated solution Take 0.5-1 mg by mouth every  4 (four) hours as needed for anxiety (shortness of breath).   Yes Historical Provider, MD  morphine (ROXANOL) 20 MG/ML concentrated solution Take 10-20 mg by mouth every 2 (two) hours as needed for moderate pain or shortness of breath.   Yes Historical Provider, MD  Nutritional Supplements (FEEDING SUPPLEMENT, JEVITY 1.5 CAL,) LIQD Place 1,000 mLs into feeding tube daily.   Yes Historical Provider, MD  sennosides (SENEXON) 8.8 MG/5ML syrup Take 10-15 mLs by mouth daily as needed for mild constipation. 10 ml daily and may take another 5 ml as needed with max  dose of 15 mls daily.   Yes Historical Provider, MD  sorbitol 70 % solution Take 30 mLs by mouth daily as needed (constipation).   Yes Historical Provider, MD  traZODone (DESYREL) 50 MG tablet Place 50 mg into feeding tube at bedtime.    Yes Historical Provider, MD  acetaminophen (TYLENOL) 160 MG/5ML solution Take 480-960 mg by mouth every 6 (six) hours as needed for mild pain, moderate pain or fever.     Historical Provider, MD    Family History Family History  Problem Relation Age of Onset  . Diabetes Mother   . Dementia Father   . Seizures Father   . Diabetes Father   . Hypertension Father   . Colon cancer Neg Hx   . Pancreatic cancer Neg Hx   . Stomach cancer Neg Hx     Social History Social History  Substance Use Topics  . Smoking status: Current Every Day Smoker    Packs/day: 1.00    Years: 50.00    Types: Cigarettes  . Smokeless tobacco: Never Used  . Alcohol use 7.2 oz/week    12 Standard drinks or equivalent per week     Comment: wine and beer. Drinks about a 6 pack of beer a day. About a 1/5 or more of wine a day.      Allergies   Review of patient's allergies indicates no known allergies.   Review of Systems Review of Systems  Unable to perform ROS: Mental status change     Physical Exam Updated Vital Signs SpO2 100%   Physical Exam  Constitutional: He appears well-developed. No distress.  Wasted appearance  HENT:  Head: Normocephalic and atraumatic.  Mouth/Throat: Oropharynx is clear and moist.  Eyes: EOM are normal. Pupils are equal, round, and reactive to light.  Pupils are 3 mm and reactive bilaterally.  Neck: Normal range of motion. Neck supple.  Trach in place with no obvious bleeding  Cardiovascular: Normal rate and regular rhythm.   Pulmonary/Chest: Breath sounds normal.  Shallow respirations  Abdominal: Soft. Bowel sounds are normal. There is no tenderness. There is no rebound and no guarding.  Musculoskeletal: Normal range of motion.  He exhibits no edema or tenderness.  Neurological:  Patient is drowsy. Nonverbal. He is following simple commands. He moves all extremities.  Skin: Skin is warm and dry. Capillary refill takes less than 2 seconds. No rash noted. No erythema.  Nursing note and vitals reviewed.    ED Treatments / Results  Labs (all labs ordered are listed, but only abnormal results are displayed) Labs Reviewed  CBC WITH DIFFERENTIAL/PLATELET - Abnormal; Notable for the following:       Result Value   RBC 2.97 (*)    Hemoglobin 9.1 (*)    HCT 26.3 (*)    All other components within normal limits  COMPREHENSIVE METABOLIC PANEL - Abnormal; Notable for the following:    Sodium  130 (*)    Potassium 2.8 (*)    Chloride 84 (*)    CO2 40 (*)    Glucose, Bld 113 (*)    Calcium 8.5 (*)    Albumin 2.8 (*)    ALT 9 (*)    All other components within normal limits  ETHANOL - Abnormal; Notable for the following:    Alcohol, Ethyl (B) 11 (*)    All other components within normal limits  URINE RAPID DRUG SCREEN, HOSP PERFORMED  URINALYSIS, ROUTINE W REFLEX MICROSCOPIC (NOT AT [facility])    EKG  EKG Interpretation  Date/Time:  Sunday August 02 2016 19:55:02 EDT Ventricular Rate:  95 PR Interval:    QRS Duration: 100 QT Interval:  382 QTC Calculation: 481 R Axis:   88 Text Interpretation:  Sinus rhythm Right atrial enlargement Anteroseptal infarct, age indeterminate Confirmed by Lita Mains  MD, Inetha Maret ([id number]) on 08/02/2016 8:17:12 PM       Radiology No results found.  Procedures Procedures (including critical care time)  Medications Ordered in ED Medications  sodium chloride 0.9 % bolus 500 mL (not administered)  potassium chloride 10 mEq in 100 mL IVPB (not administered)     Initial Impression / Assessment and Plan / ED Course  I have reviewed the triage vital signs and the nursing notes.  Pertinent labs & imaging results that were available during my care of the patient were reviewed by me and  considered in my medical decision making (see chart for details).  Clinical Course    Discussed with family at bedside. Understand that the patient wants minimal intervention and has DO NOT RESUSCITATE. We'll screen for reversible causes for the patient's drowsiness. Suspect possible alcohol intoxication versus medication side effect.  Discussed with family. Agreed to limited intervention and mostly comfort care. Discussed also with hospitalist. Will admit to observation MedSurg bed. Final Clinical Impressions(s) / ED Diagnoses   Final diagnoses:  Lethargy  Hypokalemia    New Prescriptions New Prescriptions   No medications on file     Julianne Rice, MD 08/02/16 2353

## 2016-08-02 NOTE — Progress Notes (Signed)
Suctioned patient removed inner cannula on trach cleaned , trach is emaculate clean  Breath sounds decreased only obtained small amount of secretions clear to light yellow. Patient is very lethargic. Suspect cancer pain meds.

## 2016-08-02 NOTE — ED Notes (Signed)
Tried getting urine from Pt with a catheter unable to retain any urine at this time.

## 2016-08-02 NOTE — ED Triage Notes (Signed)
EMS called out of patient "nodding off" friends concerned patient mental status is decreased from his norm. Per EMS patients friends and family stated he is a hospice patient and was given " 2 weeks to live 3 weeks ago".

## 2016-08-02 NOTE — ED Triage Notes (Signed)
[facility]

## 2016-08-02 NOTE — ED Notes (Signed)
Pt will respond to shout, oriented to person and place. Patient goes back to sleep without external stimulus

## 2016-08-03 ENCOUNTER — Encounter (HOSPITAL_COMMUNITY): Payer: Self-pay | Admitting: *Deleted

## 2016-08-03 DIAGNOSIS — Z833 Family history of diabetes mellitus: Secondary | ICD-10-CM | POA: Diagnosis not present

## 2016-08-03 DIAGNOSIS — E876 Hypokalemia: Secondary | ICD-10-CM | POA: Diagnosis present

## 2016-08-03 DIAGNOSIS — Z7289 Other problems related to lifestyle: Secondary | ICD-10-CM | POA: Diagnosis present

## 2016-08-03 DIAGNOSIS — Z789 Other specified health status: Secondary | ICD-10-CM | POA: Diagnosis not present

## 2016-08-03 DIAGNOSIS — Z66 Do not resuscitate: Secondary | ICD-10-CM | POA: Diagnosis not present

## 2016-08-03 DIAGNOSIS — F17210 Nicotine dependence, cigarettes, uncomplicated: Secondary | ICD-10-CM | POA: Diagnosis present

## 2016-08-03 DIAGNOSIS — J449 Chronic obstructive pulmonary disease, unspecified: Secondary | ICD-10-CM | POA: Diagnosis present

## 2016-08-03 DIAGNOSIS — T510X1A Toxic effect of ethanol, accidental (unintentional), initial encounter: Secondary | ICD-10-CM | POA: Diagnosis present

## 2016-08-03 DIAGNOSIS — R4182 Altered mental status, unspecified: Secondary | ICD-10-CM

## 2016-08-03 DIAGNOSIS — T424X1A Poisoning by benzodiazepines, accidental (unintentional), initial encounter: Secondary | ICD-10-CM | POA: Diagnosis present

## 2016-08-03 DIAGNOSIS — Z515 Encounter for palliative care: Secondary | ICD-10-CM | POA: Diagnosis present

## 2016-08-03 DIAGNOSIS — K219 Gastro-esophageal reflux disease without esophagitis: Secondary | ICD-10-CM | POA: Diagnosis present

## 2016-08-03 DIAGNOSIS — C139 Malignant neoplasm of hypopharynx, unspecified: Secondary | ICD-10-CM | POA: Diagnosis not present

## 2016-08-03 DIAGNOSIS — R5383 Other fatigue: Secondary | ICD-10-CM

## 2016-08-03 DIAGNOSIS — Z930 Tracheostomy status: Secondary | ICD-10-CM | POA: Diagnosis not present

## 2016-08-03 DIAGNOSIS — G92 Toxic encephalopathy: Secondary | ICD-10-CM | POA: Diagnosis present

## 2016-08-03 DIAGNOSIS — I10 Essential (primary) hypertension: Secondary | ICD-10-CM | POA: Diagnosis present

## 2016-08-03 DIAGNOSIS — Z8249 Family history of ischemic heart disease and other diseases of the circulatory system: Secondary | ICD-10-CM | POA: Diagnosis not present

## 2016-08-03 DIAGNOSIS — T402X1A Poisoning by other opioids, accidental (unintentional), initial encounter: Secondary | ICD-10-CM | POA: Diagnosis present

## 2016-08-03 LAB — URINALYSIS, ROUTINE W REFLEX MICROSCOPIC
Bilirubin Urine: NEGATIVE
GLUCOSE, UA: NEGATIVE mg/dL
HGB URINE DIPSTICK: NEGATIVE
Ketones, ur: NEGATIVE mg/dL
Leukocytes, UA: NEGATIVE
Nitrite: NEGATIVE
PH: 8.5 — AB (ref 5.0–8.0)
PROTEIN: 30 mg/dL — AB
Specific Gravity, Urine: 1.01 (ref 1.005–1.030)

## 2016-08-03 LAB — BASIC METABOLIC PANEL
Anion gap: 9 (ref 5–15)
BUN: 14 mg/dL (ref 6–20)
CO2: 37 mmol/L — AB (ref 22–32)
Calcium: 8.7 mg/dL — ABNORMAL LOW (ref 8.9–10.3)
Chloride: 88 mmol/L — ABNORMAL LOW (ref 101–111)
Creatinine, Ser: 0.65 mg/dL (ref 0.61–1.24)
GFR calc Af Amer: >60 mL/min (ref >60)
GLUCOSE: 105 mg/dL — AB (ref 65–99)
POTASSIUM: 3.4 mmol/L — AB (ref 3.5–5.1)
Sodium: 134 mmol/L — ABNORMAL LOW (ref 135–145)

## 2016-08-03 LAB — RAPID URINE DRUG SCREEN, HOSP PERFORMED
AMPHETAMINES: NOT DETECTED
BARBITURATES: NOT DETECTED
BENZODIAZEPINES: POSITIVE — AB
Cocaine: NOT DETECTED
Opiates: POSITIVE — AB
TETRAHYDROCANNABINOL: NOT DETECTED

## 2016-08-03 LAB — URINE MICROSCOPIC-ADD ON: RBC / HPF: NONE SEEN RBC/hpf (ref 0–5)

## 2016-08-03 MED ORDER — POTASSIUM CHLORIDE IN NACL 20-0.9 MEQ/L-% IV SOLN
INTRAVENOUS | Status: DC
  Administered 2016-08-03 – 2016-08-04 (×2): via INTRAVENOUS

## 2016-08-03 MED ORDER — POTASSIUM CHLORIDE 20 MEQ/15ML (10%) PO SOLN
40.0000 meq | Freq: Once | ORAL | Status: AC
  Administered 2016-08-03: 40 meq
  Filled 2016-08-03: qty 30

## 2016-08-03 MED ORDER — POTASSIUM CHLORIDE 10 MEQ/100ML IV SOLN
INTRAVENOUS | Status: AC
  Administered 2016-08-03: 10 meq
  Filled 2016-08-03: qty 100

## 2016-08-03 MED ORDER — JEVITY 1.2 CAL PO LIQD
474.0000 mL | Freq: Three times a day (TID) | ORAL | Status: DC
  Administered 2016-08-04: 474 mL
  Filled 2016-08-03 (×6): qty 1000

## 2016-08-03 NOTE — ED Notes (Signed)
Report given to floor all questions answered.

## 2016-08-03 NOTE — Progress Notes (Signed)
Respiratory Care Note: Attempted to assess and clean the patient's trach. The patient had his covers pulled over his head and would not come out. Seemed to get very agitated when asked if I could assess and clean his trach. Spoke with his RN and she is aware and also encouraged her to try later and if the patient agrees, to call me and I would come assist her with it. The patient's SAT's were 100% on a 28% ATC, HR 83. Will continue to monitor when patient allows.

## 2016-08-03 NOTE — Progress Notes (Signed)
Nutrition Brief Note  Chart reviewed. Pt admitted from home. He has declined treatment for his cancer and desires to be comfort care only.  No further nutrition interventions warranted at this time.  Please re-consult as needed.    Colman Cater MS,RD,CSG,LDN Office: [phone] Pager: [phone]

## 2016-08-03 NOTE — H&P (Signed)
History and Physical    Randy Blackwell [id number] DOB: 03-02-1951 DOA: 08/02/2016  PCP: [name], MD  Patient coming from:  home  Chief Complaint:  Found down  HPI: Randy Blackwell is a 65 y.o. male with medical history significant of hypopharyngeal cancer tracheostomy dependent, alcohol abuse, copd who has declined treatment for his cancer and is on hospice care at home, lives alone.  Was admitted this past week with bleeding from his trach and pt left AMA the next day.   Per family members he has been drinking alcohol, drinks daily and a friend found his on the floor today.  Family does not want aggressive measures.  ivf only and comfort care.  Unknown if he took too much of his pain meds today.    Review of Systems: As per HPI otherwise 10 point review of systems negative.   Past Medical History:  Diagnosis Date  . Alcohol abuse   . Bronchiectasis ([place])   . Cancer (HCC)    throat  . COPD (chronic obstructive pulmonary disease) ([place])   . GERD (gastroesophageal reflux disease)   . Hypertension    per PCP notes - pt states he's never been prescribed medication for this  . 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 Seizures ([place])    per PCP notes - pt states he's not had a seizure for over a year or more (as of 05/21/16.  [name] Shortness of breath dyspnea     Past Surgical History:  Procedure Laterality Date  . COLONOSCOPY WITH PROPOFOL N/A 05/05/2016   Procedure: COLONOSCOPY WITH PROPOFOL;  Surgeon: Danie Binder, MD;  [facility];  Service: Endoscopy;  Laterality: N/A;  0730  . DIRECT LARYNGOSCOPY  05/22/2016  . DIRECT LARYNGOSCOPY N/A 05/22/2016   Procedure: DIRECT LARYNGOSCOPY;  Surgeon: Melida Quitter, MD;  Location: [place];  Service: ENT;  Laterality: N/A;  direct laryngoscopy with biopsy  . ESOPHAGOGASTRODUODENOSCOPY (EGD) WITH PROPOFOL N/A 05/05/2016   Procedure: ESOPHAGOGASTRODUODENOSCOPY (EGD) WITH PROPOFOL;  Surgeon: Danie Binder, MD;  [facility];   Service: Endoscopy;  Laterality: N/A;  . ESOPHAGOSCOPY N/A 05/22/2016   Procedure: ESOPHAGOSCOPY;  Surgeon: Melida Quitter, MD;  Location: [place];  Service: ENT;  Laterality: N/A;  . None    . POLYPECTOMY  05/05/2016   Procedure: POLYPECTOMY;  Surgeon: Danie Binder, MD;  [facility];  Service: Endoscopy;;  cecal polyp cold bx,  . TRACHEOSTOMY       reports that he has been smoking Cigarettes.  He has a 50.00 pack-year smoking history. He has never used smokeless tobacco. He reports that he drinks about 7.2 oz of alcohol per week . He reports that he uses drugs, including Marijuana.  No Known Allergies  Family History  Problem Relation Age of Onset  . Diabetes Mother   . Dementia Father   . Seizures Father   . Diabetes Father   . Hypertension Father   . Colon cancer Neg Hx   . Pancreatic cancer Neg Hx   . Stomach cancer Neg Hx     Prior to Admission medications   Medication Sig Start Date End Date Taking? Authorizing Provider  acetaminophen (TYLENOL) 650 MG suppository Place 650 mg rectally every 4 (four) hours as needed. For pain   Yes Historical Provider, MD  guaifenesin (ROBITUSSIN) 100 MG/5ML syrup Place 200-300 mg into feeding tube 3 (three) times daily as needed for cough.    Yes Historical Provider, MD  haloperidol (  HALDOL) 2 MG/ML solution Take 1 mg by mouth every 6 (six) hours as needed.   Yes Historical Provider, MD  hyoscyamine (LEVSIN SL) 0.125 MG SL tablet Take 0.125 mg by mouth every 6 (six) hours as needed.   Yes Historical Provider, MD  LORazepam (ATIVAN) 2 MG/ML concentrated solution Take 0.5-1 mg by mouth every 4 (four) hours as needed for anxiety (shortness of breath).   Yes Historical Provider, MD  morphine (ROXANOL) 20 MG/ML concentrated solution Take 10-20 mg by mouth every 2 (two) hours as needed for moderate pain or shortness of breath.   Yes Historical Provider, MD  Nutritional Supplements (FEEDING SUPPLEMENT, JEVITY 1.5 CAL,) LIQD Place 1,000 mLs into  feeding tube daily.   Yes Historical Provider, MD  sennosides (SENEXON) 8.8 MG/5ML syrup Take 10-15 mLs by mouth daily as needed for mild constipation. 10 ml daily and may take another 5 ml as needed with max dose of 15 mls daily.   Yes Historical Provider, MD  sorbitol 70 % solution Take 30 mLs by mouth daily as needed (constipation).   Yes Historical Provider, MD  traZODone (DESYREL) 50 MG tablet Place 50 mg into feeding tube at bedtime.    Yes Historical Provider, MD  acetaminophen (TYLENOL) 160 MG/5ML solution Take 480-960 mg by mouth every 6 (six) hours as needed for mild pain, moderate pain or fever.     Historical Provider, MD    Physical Exam: Vitals:   08/02/16 2041  SpO2: 100%      Constitutional: NAD, calm, comfortable Vitals:   08/02/16 2041  SpO2: 100%   Eyes: PERRL, lids and conjunctivae normal very cachectic ENMT: Mucous membranes are dry Posterior pharynx clear of any exudate or lesions.Normal dentition.  Neck: normal, supple, no masses, no thyromegaly trach c/d/i Respiratory: clear to auscultation bilaterally, no wheezing, no crackles. Normal respiratory effort. No accessory muscle use.  Cardiovascular: Regular rate and rhythm, no murmurs / rubs / gallops. No extremity edema. 2+ pedal pulses. No carotid bruits.  Abdomen: no tenderness, no masses palpated. No hepatosplenomegaly. Bowel sounds positive.  Musculoskeletal: no clubbing / cyanosis. No joint deformity upper and lower extremities. Good ROM, no contractures. Normal muscle tone.  Skin: no rashes, lesions, ulcers. No induration Neurologic: CN 2-12 grossly intact. Sensation intact, DTR normal. Strength 5/5 in all 4.  Psychiatric: Normal judgment and insight. Alert and oriented x 3. Normal mood.    Labs on Admission: I have personally reviewed following labs and imaging studies  CBC:  Recent Labs Lab 07/30/16 0303 08/02/16 2128  WBC 7.5 8.2  NEUTROABS 5.8 5.9  HGB 10.7* 9.1*  HCT 31.3* 26.3*  MCV 87.9  88.6  PLT 438* [id number]   Basic Metabolic Panel:  Recent Labs Lab 07/30/16 0303 08/02/16 2128  NA 128* 130*  K 3.4* 2.8*  CL 79* 84*  CO2 44* 40*  GLUCOSE 193* 113*  BUN 25* 14  CREATININE 1.06 0.70  CALCIUM 8.8* 8.5*   GFR: Estimated Creatinine Clearance: 54 mL/min (by C-G formula based on SCr of 0.8 mg/dL). Liver Function Tests:  Recent Labs Lab 08/02/16 2128  AST 18  ALT 9*  ALKPHOS 73  BILITOT 0.6  PROT 6.5  ALBUMIN 2.8*   Coagulation Profile:  Recent Labs Lab 07/30/16 0303  INR 0.99   Urine analysis:    Component Value Date/Time   COLORURINE YELLOW 04/23/2016 1313   APPEARANCEUR HAZY (A) 04/23/2016 1313   LABSPEC 1.025 04/23/2016 1313   PHURINE 6.0 04/23/2016 1313   GLUCOSEU  NEGATIVE 04/23/2016 1313   HGBUR NEGATIVE 04/23/2016 1313   BILIRUBINUR SMALL (A) 04/23/2016 1313   KETONESUR TRACE (A) 04/23/2016 1313   PROTEINUR 30 (A) 04/23/2016 1313   NITRITE NEGATIVE 04/23/2016 1313   LEUKOCYTESUR MODERATE (A) 04/23/2016 1313   Assessment/Plan 65 yo male with head/neck cancer hospice patient at home comes in unresponsive  Principal Problem:   Altered mental status- likely due to combination of his etoh intake and many sedatives taking at home.  Family not interested in abx.  No aggressive work up.  They know patient would not want comfort care only.  He left here AMA 3 days ago.  Will obs overnight and see if he arouses.  If not pt will need hospice house/SNF as he lives alone.     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

## 2016-08-03 NOTE — Progress Notes (Signed)
PROGRESS NOTE  Randy Blackwell  [id number] DOB: 01/23/1951 DOA: 08/02/2016 PCP: [name], MD Outpatient Specialists:  Subjective: Seen with his sister-in-law at bedside appeared comfortable.  Brief Narrative:  Randy Blackwell is a 65 y.o. male with medical history significant of hypopharyngeal cancer tracheostomy dependent, alcohol abuse, copd who has declined treatment for his cancer and is on hospice care at home, lives alone.  Was admitted this past week with bleeding from his trach and pt left AMA the next day.   Per family members he has been drinking alcohol, drinks daily and a friend found his on the floor today.  Family does not want aggressive measures.  ivf only and comfort care.  Unknown if he took too much of his pain meds today.   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 Shanon Brow. I seen the patient and examined him and reviewed the chart. Patient is more awake and alert than what its described in the notes from earlier today. Start diet through the tube, has very foul smell coming out of his tracheostomy will provide tracheostomy care. According to his sister alone bedside reported that he is hospice.   DVT prophylaxis:  Code Status: DNR Family Communication:  Disposition Plan:  Diet: Diet NPO time specified  Consultants:   None  Procedures:   None  Antimicrobials:   None   Objective: Vitals:   08/03/16 0058 08/03/16 0103 08/03/16 0615 08/03/16 0958  BP: 112/73  (!) 103/55   Pulse: 77  71 73  Resp: 18  18 16   Temp: 97.5 F (36.4 C)  97.6 F (36.4 C)   TempSrc: Oral  Axillary   SpO2: 100% 100% 100%     Intake/Output Summary (Last 24 hours) at 08/03/16 1207 Last data filed at 08/03/16 0958  Gross per 24  hour  Intake                0 ml  Output              250 ml  Net             -250 ml   There were no vitals filed for this visit.  Examination: General exam: Appears calm and comfortable Cachectic African-American male Respiratory system: Clear to auscultation. Respiratory effort normal. Cardiovascular system: S1 & S2 heard, RRR. No JVD, murmurs, rubs, gallops or clicks. No pedal edema. Gastrointestinal system: Abdomen is nondistended, soft and nontender. No organomegaly or masses felt. Normal bowel sounds heard. Central nervous system: Alert and oriented. No focal neurological deficits. Extremities: Symmetric 5 x 5 power. Skin: No rashes, lesions or ulcers Psychiatry: Judgement and insight appear normal. Mood & affect appropriate.   Data Reviewed: I have personally reviewed following labs and imaging studies  CBC:  Recent Labs Lab 07/30/16 0303 08/02/16 2128  WBC 7.5 8.2  NEUTROABS 5.8 5.9  HGB 10.7* 9.1*  HCT 31.3* 26.3*  MCV 87.9 88.6  PLT 438* Q000111Q   Basic Metabolic Panel:  Recent Labs Lab 07/30/16 0303 08/02/16 2128 08/03/16 0641  NA 128* 130* 134*  K 3.4* 2.8* 3.4*  CL 79* 84* 88*  CO2 44* 40* 37*  GLUCOSE 193* 113* 105*  BUN 25* 14 14  CREATININE 1.06  0.70 0.65  CALCIUM 8.8* 8.5* 8.7*   GFR: Estimated Creatinine Clearance: 54 mL/min (by C-G formula based on SCr of 0.8 mg/dL). Liver Function Tests:  Recent Labs Lab 08/02/16 2128  AST 18  ALT 9*  ALKPHOS 73  BILITOT 0.6  PROT 6.5  ALBUMIN 2.8*   No results for input(s): LIPASE, AMYLASE in the last 168 hours. No results for input(s): AMMONIA in the last 168 hours. Coagulation Profile:  Recent Labs Lab 07/30/16 0303  INR 0.99   Cardiac Enzymes: No results for input(s): CKTOTAL, CKMB, CKMBINDEX, TROPONINI in the last 168 hours. BNP (last 3 results) No results for input(s): PROBNP in the last 8760 hours. HbA1C: No results for input(s): HGBA1C in the last 72 hours. CBG: No results for  input(s): GLUCAP in the last 168 hours. Lipid Profile: No results for input(s): CHOL, HDL, LDLCALC, TRIG, CHOLHDL, LDLDIRECT in the last 72 hours. Thyroid Function Tests: No results for input(s): TSH, T4TOTAL, FREET4, T3FREE, THYROIDAB in the last 72 hours. Anemia Panel: No results for input(s): VITAMINB12, FOLATE, FERRITIN, TIBC, IRON, RETICCTPCT in the last 72 hours. Urine analysis:    Component Value Date/Time   COLORURINE YELLOW 04/23/2016 1313   APPEARANCEUR HAZY (A) 04/23/2016 1313   LABSPEC 1.025 04/23/2016 1313   PHURINE 6.0 04/23/2016 1313   GLUCOSEU NEGATIVE 04/23/2016 1313   HGBUR NEGATIVE 04/23/2016 1313   BILIRUBINUR SMALL (A) 04/23/2016 1313   KETONESUR TRACE (A) 04/23/2016 1313   PROTEINUR 30 (A) 04/23/2016 1313   NITRITE NEGATIVE 04/23/2016 1313   LEUKOCYTESUR MODERATE (A) 04/23/2016 1313   Sepsis Labs: @LABRCNTIP (procalcitonin:4,lacticidven:4)  )No results found for this or any previous visit (from the past 240 hour(s)).   Invalid input(s): PROCALCITONIN, [place]   Radiology Studies: No results found.      Scheduled Meds:  Continuous Infusions: . 0.9 % NaCl with KCl 20 mEq / L 50 mL/hr at 08/03/16 0240     LOS: 0 days    Time spent: 35 minutes    [name] A, MD [facility] Pager [phone]  If 7PM-7AM, please contact night-coverage [email or web address] Password [email or web address] 08/03/2016, 12:07 PM

## 2016-08-03 NOTE — Progress Notes (Signed)
Patient continues to refuse respiratory and nursing staff to care for his trach needs. Patient is still being monitored by pulse ox and is still on oxygen with humidification. Respiratory will continue to monitor patient for and changes that may occur.

## 2016-08-04 DIAGNOSIS — E876 Hypokalemia: Secondary | ICD-10-CM

## 2016-08-04 DIAGNOSIS — C139 Malignant neoplasm of hypopharynx, unspecified: Secondary | ICD-10-CM

## 2016-08-04 DIAGNOSIS — Z789 Other specified health status: Secondary | ICD-10-CM

## 2016-08-04 DIAGNOSIS — Z515 Encounter for palliative care: Secondary | ICD-10-CM

## 2016-08-04 DIAGNOSIS — Z66 Do not resuscitate: Secondary | ICD-10-CM

## 2016-08-04 LAB — BASIC METABOLIC PANEL
Anion gap: 4 — ABNORMAL LOW (ref 5–15)
BUN: 14 mg/dL (ref 6–20)
CALCIUM: 8.6 mg/dL — AB (ref 8.9–10.3)
CO2: 30 mmol/L (ref 22–32)
CREATININE: 0.54 mg/dL — AB (ref 0.61–1.24)
Chloride: 100 mmol/L — ABNORMAL LOW (ref 101–111)
GFR calc Af Amer: >60 mL/min (ref >60)
GFR calc non Af Amer: >60 mL/min (ref >60)
GLUCOSE: 140 mg/dL — AB (ref 65–99)
Potassium: 5.4 mmol/L — ABNORMAL HIGH (ref 3.5–5.1)
Sodium: 134 mmol/L — ABNORMAL LOW (ref 135–145)

## 2016-08-04 MED ORDER — SODIUM CHLORIDE 0.9 % IV SOLN: INTRAVENOUS | Status: DC

## 2016-08-04 MED ORDER — MORPHINE SULFATE (CONCENTRATE) 10 MG/0.5ML PO SOLN
10.0000 mg | Freq: Once | ORAL | Status: AC
  Administered 2016-08-04: 10 mg
  Filled 2016-08-04: qty 0.5

## 2016-08-04 NOTE — Discharge Summary (Signed)
Physician Discharge Summary  Randy Blackwell [id number] DOB: Sep 10, 1951 DOA: 08/02/2016  PCP: [name], MD  Admit date: 08/02/2016 Discharge date: 08/04/2016  Admitted From: Home Disposition: Home with hospice  Recommendations for Outpatient Follow-up:  1. Follow up with PCP in 1-2 weeks 2. Please obtain BMP/CBC in one week:  Home Health: Hospice Equipment/Devices:   Discharge Condition: Fair CODE STATUS: DNR Diet recommendation: NPO feeding via PEG  Brief/Interim Summary: Randy Blackwell is a 65 y.o. male with medical history significant of hypopharyngeal cancer tracheostomy dependent, alcohol abuse, copd who has declined treatment for his cancer and is on hospice care at home, lives alone.  Was admitted this past week with bleeding from his trach and pt left AMA the next day.   Per family members he has been drinking alcohol, drinks daily and a friend found his on the floor today.  Family does not want aggressive measures.  ivf only and comfort care.  Unknown if he took too much of his pain meds today.    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 -Patient was found on the floor at home, unclear what happened. -He is nothing by mouth but family reported that he inject alcohol through the PEG tube as well as his other meds. -Patient observed overnight, he feels much better, he is widely awake and alert.  Carcinoma of the hypopharynx -Patient is hospice patient, follow with hospice at home.  Status post tracheostomy -Has very foul smell coming out of his tracheostomy tube, tracheostomy care team was consulted. -Patient refused to let the RT take care of his trach set or suction it.    Alcohol use -Counseled with the presence of his sister-in-law at bedside. -Patient pulled the covers over his head or start to look about alcohol.  Hospice care -Secondary to HEENT cancer, continue hospice care.   Discharge Instructions  Discharge Instructions    Diet - low sodium heart healthy    Complete by:  As directed   Increase activity slowly    Complete by:  As directed       Medication List    TAKE these medications   acetaminophen 650 MG suppository Commonly known as:  TYLENOL Place 650 mg rectally every 4 (four) hours as needed. For pain   acetaminophen 160 MG/5ML solution Commonly known as:  TYLENOL Take 480-960 mg by mouth every 6 (six) hours as needed for mild pain, moderate pain or fever.   feeding supplement (JEVITY 1.5 CAL) Liqd Place 1,000 mLs into feeding tube daily.   guaifenesin 100 MG/5ML syrup Commonly known as:  ROBITUSSIN Place 200-300 mg into feeding tube 3 (three) times daily as needed for cough.   haloperidol 2 MG/ML solution Commonly known as:  HALDOL Take 1 mg by mouth every 6 (six) hours as needed.   hyoscyamine 0.125 MG SL tablet Commonly known as:  LEVSIN SL Take 0.125 mg by mouth every 6 (six) hours as needed.   LORazepam 2 MG/ML concentrated solution Commonly known as:  ATIVAN Take 0.5-1 mg by mouth every 4 (four) hours as needed for anxiety (shortness of breath).   morphine 20 MG/ML concentrated solution Commonly known as:  ROXANOL Take 10-20 mg by mouth every 2 (two) hours  as needed for moderate pain or shortness of breath.   SENEXON 8.8 MG/5ML syrup Generic drug:  sennosides Take 10-15 mLs by mouth daily as needed for mild constipation. 10 ml daily and may take another 5 ml as needed with max dose of 15 mls daily.   sorbitol 70 % solution Take 30 mLs by mouth daily as needed (constipation).   traZODone 50 MG tablet Commonly known as:  DESYREL Place 50 mg into feeding tube at bedtime.       No Known  Allergies  Consultations:  None   Procedures/Studies: Ct Soft Tissue Neck W Contrast  Result Date: 07/17/2016 CLINICAL DATA:  Tracheostomy dislodged earlier today, reinserted by the patient. Coughing up blood since then. Known pharyngeal mass. EXAM: CT NECK WITH CONTRAST TECHNIQUE: Multidetector CT imaging of the neck was performed using the standard protocol following the bolus administration of intravenous contrast. CONTRAST:  183mL ISOVUE-300 IOPAMIDOL (ISOVUE-300) INJECTION 61% COMPARISON:  CT 06/03/2016 FINDINGS: Tracheostomy tube appears grossly appropriately positioned. No soft tissue complications seen at the tracheostomy site. Large hypo pharyngeal to supraglottic mass remains evident as seen previously. Diameter of this lesion is at least 5.7 cm. Pronounced mass effect upon the hypopharynx in the upper airway. No evidence of vascular occlusion. There is soft tissue edema in the region probably secondary to radiation. Chronic spondylosis as seen previously. IMPRESSION: Tracheostomy appears to be repositioned without complication and appears grossly well positioned. Large right hypo pharyngeal to supraglottic mass measuring up to 5.7 cm in diameter as seen previously. Regional edema probably relates to radiation. No qualitative change since the previous study. Electronically Signed   By: Nelson Chimes M.D.   On: 07/17/2016 20:14   Ct Chest W Contrast  Result Date: 07/17/2016 CLINICAL DATA:  Bleeding from tracheostomy site. Known pharyngeal mass. EXAM: CT CHEST WITH CONTRAST TECHNIQUE: Multidetector CT imaging of the chest was performed during intravenous contrast administration. CONTRAST:  176mL ISOVUE-300 IOPAMIDOL (ISOVUE-300) INJECTION 61% COMPARISON:  CT of the chest April 10, 2016 and CT of the neck June 03, 2016 FINDINGS: The patient's tracheostomy tube is in good position. A small amount of fluid is seen posteriorly in the trachea, probably mucus. The central airways are otherwise normal.  Mild scarring is seen in the lung apices. Emphysematous changes are seen in the lungs. Bronchiectasis remains in the right lower lobe, not significantly changed. Just distal and posterior to the bronchiectasis is more focal opacity, unchanged, likely scarring. No suspicious pulmonary nodules or masses. Mild ground-glass in the right middle lobe and central right lower lobe is likely infectious or inflammatory process. No other focal infiltrate. The patient has known large pharyngeal mass is again identified extending from the hypopharynx to below the level the cricoid posteriorly. The mass appears to extend more inferiorly within the larynx. A full description of the known mass will be made on the CT of the neck. A few prominent nodes are seen in the right neck. No effusions. No adenopathy is seen within the chest. The thoracic aorta is non aneurysmal with scattered atherosclerotic change. The heart is unchanged. There are coronary artery calcifications. Evaluation of the central pulmonary arteries is unremarkable. Evaluation of the upper abdomen is limited but unchanged and unremarkable. No acute bony abnormalities. IMPRESSION: 1. The patient's known pharyngeal mass, extending into the larynx, may have progressed in the interval. However, the mass will be more fully described on the CT of the neck. 2. Bronchiectasis in the right lower lobe with adjacent soft tissue opacity,  likely scarring, is unchanged. 3. No other acute abnormalities in the chest. Electronically Signed   By: Dorise Bullion III M.D   On: 07/17/2016 20:17   Dg Chest Portable 1 View  Result Date: 07/30/2016 CLINICAL DATA:  Bleeding from tracheostomy for 1 hour. EXAM: PORTABLE CHEST 1 VIEW COMPARISON:  Chest CT 07/17/2016 FINDINGS: Tracheostomy tube at the thoracic inlet. Lung emphysema with right lower lobe scarring, bronchiectasis on CT. Normal heart size and mediastinal contours. No pulmonary edema, focal airspace opacity, pleural effusion or  pneumothorax. Stable osseous structures. IMPRESSION: Unchanged position of tracheostomy tube at the thoracic inlet. Emphysema and right lower lobe scarring. Electronically Signed   By: Jeb Levering M.D.   On: 07/30/2016 03:25    (Echo, Carotid, EGD, Colonoscopy, ERCP)    Subjective:   Discharge Exam: Vitals:   08/03/16 2256 08/04/16 0711  BP: (!) 104/53 (!) 107/48  Pulse: 70 60  Resp: 20 20  Temp: 98.4 F (36.9 C) 98.1 F (36.7 C)   Vitals:   08/03/16 0958 08/03/16 1352 08/03/16 2256 08/04/16 0711  BP:  105/70 (!) 104/53 (!) 107/48  Pulse: 73 71 70 60  Resp: 16 16 20 20   Temp:  97.8 F (36.6 C) 98.4 F (36.9 C) 98.1 F (36.7 C)  TempSrc:  Oral Oral Oral  SpO2:  100% 100% 100%  Weight:   39.9 kg (88 lb)   Height:   5\' 11"  (1.803 m)     General: Pt is alert, awake, not in acute distress Cardiovascular: RRR, S1/S2 +, no rubs, no gallops Respiratory: CTA bilaterally, no wheezing, no rhonchi Abdominal: Soft, NT, ND, bowel sounds + Extremities: no edema, no cyanosis    The results of significant diagnostics from this hospitalization (including imaging, microbiology, ancillary and laboratory) are listed below for reference.     Microbiology: No results found for this or any previous visit (from the past 240 hour(s)).   Labs: BNP (last 3 results) No results for input(s): BNP in the last 8760 hours. Basic Metabolic Panel:  Recent Labs Lab 07/30/16 0303 08/02/16 2128 08/03/16 0641 08/04/16 0855  NA 128* 130* 134* 134*  K 3.4* 2.8* 3.4* 5.4*  CL 79* 84* 88* 100*  CO2 44* 40* 37* 30  GLUCOSE 193* 113* 105* 140*  BUN 25* 14 14 14   CREATININE 1.06 0.70 0.65 0.54*  CALCIUM 8.8* 8.5* 8.7* 8.6*   Liver Function Tests:  Recent Labs Lab 08/02/16 2128  AST 18  ALT 9*  ALKPHOS 73  BILITOT 0.6  PROT 6.5  ALBUMIN 2.8*   No results for input(s): LIPASE, AMYLASE in the last 168 hours. No results for input(s): AMMONIA in the last 168 hours. CBC:  Recent  Labs Lab 07/30/16 0303 08/02/16 2128  WBC 7.5 8.2  NEUTROABS 5.8 5.9  HGB 10.7* 9.1*  HCT 31.3* 26.3*  MCV 87.9 88.6  PLT 438* 338   Cardiac Enzymes: No results for input(s): CKTOTAL, CKMB, CKMBINDEX, TROPONINI in the last 168 hours. BNP: Invalid input(s): POCBNP CBG: No results for input(s): GLUCAP in the last 168 hours. D-Dimer No results for input(s): DDIMER in the last 72 hours. Hgb A1c No results for input(s): HGBA1C in the last 72 hours. Lipid Profile No results for input(s): CHOL, HDL, LDLCALC, TRIG, CHOLHDL, LDLDIRECT in the last 72 hours. Thyroid function studies No results for input(s): TSH, T4TOTAL, T3FREE, THYROIDAB in the last 72 hours.  Invalid input(s): FREET3 Anemia work up No results for input(s): VITAMINB12, FOLATE, FERRITIN, TIBC, IRON, RETICCTPCT  in the last 72 hours. Urinalysis    Component Value Date/Time   COLORURINE YELLOW 08/03/2016 1600   APPEARANCEUR CLEAR 08/03/2016 1600   LABSPEC 1.010 08/03/2016 1600   PHURINE 8.5 (H) 08/03/2016 1600   GLUCOSEU NEGATIVE 08/03/2016 1600   HGBUR NEGATIVE 08/03/2016 1600   BILIRUBINUR NEGATIVE 08/03/2016 1600   KETONESUR NEGATIVE 08/03/2016 1600   PROTEINUR 30 (A) 08/03/2016 1600   NITRITE NEGATIVE 08/03/2016 1600   LEUKOCYTESUR NEGATIVE 08/03/2016 1600   Sepsis Labs Invalid input(s): PROCALCITONIN,  WBC,  LACTICIDVEN Microbiology No results found for this or any previous visit (from the past 240 hour(s)).   Time coordinating discharge: Over 30 minutes  SIGNED:   Birdie Hopes, MD  [facility] 08/04/2016, 10:38 AM Pager   If 7PM-7AM, please contact night-coverage [email or web address] Password [email or web address]

## 2016-08-04 NOTE — Progress Notes (Signed)
Patient discharging back home.  IV removed - WNL.  Reviewed DC instructions - verbalized understanding.  No changes to medications.  Awaiting family to arrive to take home.  In NAD.

## 2016-08-04 NOTE — Care Management Note (Signed)
Case Management Note  Patient Details  Name: Randy Blackwell MRN: [id number] Date of Birth: 22-Mar-1951  Subjective/Objective:                  Pt is from home, lives alone and has brother and sister who check on him frequently. Pt is active with [facility]. Marcene Brawn, RN with hospice, has made contact and discussed going to the hospice facility with pt. Pt refuses and wishes to return home with hospice services at home. Family voices concern over pt's wellbeing at home alone. Pt is alert and oriented and able to make his own decisions. Pt's right to choose discussed with family also.Pt has home O2 and has no DME needs.  Sister will provide transport home. Hospice agency aware pt will DC home today.   Action/Plan: Discharging home today with resumption of Hospice services through [facility].   Expected Discharge Date:    08/04/2016              Expected Discharge Plan:  Home w Hospice Care  In-House Referral:  NA  Discharge planning Services  CM Consult  Post Acute Care Choice:  Resumption of Svcs/PTA Provider, Hospice Choice offered to:  Patient  DME Arranged:    DME Agency:     HH Arranged:    [place] Agency:     Status of Service:  Completed, signed off  If discussed at [employer], dates discussed:    Additional Comments:  Sherald Barge, RN 08/04/2016, 2:30 PM

## 2016-08-26 ENCOUNTER — Encounter (HOSPITAL_COMMUNITY): Payer: Self-pay | Admitting: Emergency Medicine

## 2016-08-26 ENCOUNTER — Emergency Department (HOSPITAL_COMMUNITY)
Admission: EM | Admit: 2016-08-26 | Discharge: 2016-08-26 | Disposition: A | Discharge status: Home / Self Care | Payer: Medicaid Other | Attending: Emergency Medicine | Admitting: Emergency Medicine

## 2016-08-26 ENCOUNTER — Emergency Department (HOSPITAL_COMMUNITY): Payer: Medicaid Other

## 2016-08-26 DIAGNOSIS — I10 Essential (primary) hypertension: Secondary | ICD-10-CM | POA: Diagnosis not present

## 2016-08-26 DIAGNOSIS — F17210 Nicotine dependence, cigarettes, uncomplicated: Secondary | ICD-10-CM | POA: Insufficient documentation

## 2016-08-26 DIAGNOSIS — J449 Chronic obstructive pulmonary disease, unspecified: Secondary | ICD-10-CM | POA: Insufficient documentation

## 2016-08-26 DIAGNOSIS — Z79899 Other long term (current) drug therapy: Secondary | ICD-10-CM | POA: Diagnosis not present

## 2016-08-26 DIAGNOSIS — Z85818 Personal history of malignant neoplasm of other sites of lip, oral cavity, and pharynx: Secondary | ICD-10-CM | POA: Diagnosis not present

## 2016-08-26 DIAGNOSIS — J9503 Malfunction of tracheostomy stoma: Secondary | ICD-10-CM | POA: Diagnosis present

## 2016-08-26 DIAGNOSIS — Z430 Encounter for attention to tracheostomy: Secondary | ICD-10-CM

## 2016-08-26 MED ORDER — HYDROGEN PEROXIDE 3 % EX SOLN
CUTANEOUS | Status: AC
  Administered 2016-08-26: 16:00:00
  Filled 2016-08-26: qty 473

## 2016-08-26 NOTE — ED Notes (Signed)
Pt returned from xray. Nad.

## 2016-08-26 NOTE — ED Triage Notes (Signed)
Pt states he coughed hard today and trach fell out. Trach at bedside in bag. Nad. Breathing well with trach mask 02 at 10L. Pt denies any symptoms. A/o

## 2016-08-26 NOTE — ED Notes (Signed)
RT called

## 2016-08-26 NOTE — ED Provider Notes (Signed)
[facility] DEPT Provider Note   CSN: [id number] Arrival date & time: 08/26/16  1407     History   Chief Complaint Chief Complaint  Patient presents with  . Tracheostomy Tube Change    HPI Randy Blackwell is a 65 y.o. male. 66 year old male with past medical history of throat cancer, status post tracheostomy, chronic alcoholism, on hospice, who presents with dislodgment of his tracheostomy. Patient states he had a coughing episode at approximate 10 AM today. His tracheostomy became dislodged and fell onto his lap. He was unable to get it back in and subsequently presents for evaluation. He states he is otherwise at his baseline set of health. He has a history of recurrent bleeding around his tracheostomy but has refused intervention for this in the past. He has been drinking his usual amount of alcohol. No fevers or chills. No cough or increased or change in sputum production. No shortness of breath or chest pain  HPI  Past Medical History:  Diagnosis Date  . Alcohol abuse   . Bronchiectasis ([place])   . Cancer (H[facility])    throat  . COPD (chronic obstructive pulmonary disease) ([place])   . GERD (gastroesophageal reflux disease)   . Hypertension    per PCP notes - pt states he's never been prescribed medication for this  . Malignant tumor hypopharynx ([place])   . Seizures ([place])    per PCP notes - pt states he's not had a seizure for over a year or more (as of 05/21/16.  [name] Shortness of breath dyspnea     Patient Active Problem List   Diagnosis Date Noted  . Alcohol use ([place]) 08/03/2016  . Lethargy   . Altered mental status 08/02/2016  . Tracheostomy hemorrhage ([place]) 07/30/2016  . DNR (do not resuscitate) 07/30/2016  . Hospice care patient 07/30/2016  . Hemoptysis 07/17/2016  . Hypokalemia 07/17/2016  . Hyponatremia 07/17/2016  . Anemia 07/17/2016  . Carcinoma of hypopharynx ([place]) 05/22/2016  . Loss of weight 04/30/2016  . Dysphagia 04/30/2016    Past Surgical History:    Procedure Laterality Date  . COLONOSCOPY WITH PROPOFOL N/A 05/05/2016   Procedure: COLONOSCOPY WITH PROPOFOL;  Surgeon: Danie Binder, MD;  [facility];  Service: Endoscopy;  Laterality: N/A;  0730  . DIRECT LARYNGOSCOPY  05/22/2016  . DIRECT LARYNGOSCOPY N/A 05/22/2016   Procedure: DIRECT LARYNGOSCOPY;  Surgeon: Melida Quitter, MD;  Location: [facility];  Service: ENT;  Laterality: N/A;  direct laryngoscopy with biopsy  . ESOPHAGOGASTRODUODENOSCOPY (EGD) WITH PROPOFOL N/A 05/05/2016   Procedure: ESOPHAGOGASTRODUODENOSCOPY (EGD) WITH PROPOFOL;  Surgeon: Danie Binder, MD;  [facility];  Service: Endoscopy;  Laterality: N/A;  . ESOPHAGOSCOPY N/A 05/22/2016   Procedure: ESOPHAGOSCOPY;  Surgeon: Melida Quitter, MD;  Location: [facility];  Service: ENT;  Laterality: N/A;  . None    . POLYPECTOMY  05/05/2016   Procedure: POLYPECTOMY;  Surgeon: Danie Binder, MD;  [facility];  Service: Endoscopy;;  cecal polyp cold bx,  . TRACHEOSTOMY         Home Medications    Prior to Admission medications   Medication Sig Start Date End Date Taking? Authorizing Provider  acetaminophen (TYLENOL) 160 MG/5ML solution Take 480-960 mg by mouth every 6 (six) hours as needed for mild pain, moderate pain or fever.    Yes Historical Provider, MD  acetaminophen (TYLENOL) 650 MG suppository Place 650 mg rectally every 4 (four) hours as needed. For pain   Yes Historical Provider,  MD  guaifenesin (ROBITUSSIN) 100 MG/5ML syrup Place 200-300 mg into feeding tube 3 (three) times daily as needed for cough.    Yes Historical Provider, MD  LORazepam (ATIVAN) 2 MG/ML concentrated solution Take 0.5-1 mg by mouth every 4 (four) hours as needed for anxiety (shortness of breath).   Yes Historical Provider, MD  Nutritional Supplements (FEEDING SUPPLEMENT, JEVITY 1.5 CAL,) LIQD Place 1,000 mLs into feeding tube daily.   Yes Historical Provider, MD  sennosides (SENEXON) 8.8 MG/5ML syrup Take 10-15 mLs by mouth  daily as needed for mild constipation. 10 ml daily and may take another 5 ml as needed with max dose of 15 mls daily.   Yes Historical Provider, MD  sorbitol 70 % solution Take 30 mLs by mouth daily as needed (constipation).   Yes Historical Provider, MD  traZODone (DESYREL) 50 MG tablet Place 50 mg into feeding tube at bedtime.    Yes Historical Provider, MD    Family History Family History  Problem Relation Age of Onset  . Diabetes Mother   . Dementia Father   . Seizures Father   . Diabetes Father   . Hypertension Father   . Colon cancer Neg Hx   . Pancreatic cancer Neg Hx   . Stomach cancer Neg Hx     Social History Social History  Substance Use Topics  . Smoking status: Current Every Day Smoker    Packs/day: 1.00    Years: 50.00    Types: Cigarettes  . Smokeless tobacco: Never Used  . Alcohol use 7.2 oz/week    12 Standard drinks or equivalent per week     Comment: wine and beer. Drinks about a 6 pack of beer a day. About a 1/5 or more of wine a day.      Allergies   Review of patient's allergies indicates no known allergies.   Review of Systems Review of Systems  Constitutional: Negative for chills, fatigue and fever.  HENT: Negative for congestion and rhinorrhea.   Eyes: Negative for visual disturbance.  Respiratory: Negative for cough, shortness of breath and wheezing.   Cardiovascular: Negative for chest pain and leg swelling.  Gastrointestinal: Negative for abdominal pain, diarrhea, nausea and vomiting.  Genitourinary: Negative for dysuria and flank pain.  Musculoskeletal: Negative for neck pain and neck stiffness.  Skin: Negative for rash and wound.  Allergic/Immunologic: Negative for immunocompromised state.  Neurological: Negative for syncope, weakness and headaches.  All other systems reviewed and are negative.    Physical Exam Updated Vital Signs BP 110/78 (BP Location: Left Arm)   Pulse 93   Temp 98 F (36.7 C) (Oral)   Resp 17   SpO2 99%    Physical Exam  Constitutional: He is oriented to person, place, and time. He appears well-developed and well-nourished. No distress.  HENT:  Head: Normocephalic and atraumatic.  Eyes: Conjunctivae are normal.  Neck: Neck supple.  Tracheostomy site widely patent with well-healed wound edges. Good aeration. Normal work of breathing. No stridor. No bleeding.  Cardiovascular: Normal rate, regular rhythm and normal heart sounds.  Exam reveals no friction rub.   No murmur heard. Pulmonary/Chest: Effort normal and breath sounds normal. No respiratory distress. He has no wheezes. He has no rales.  Abdominal: He exhibits no distension.  Musculoskeletal: He exhibits no edema.  Neurological: He is alert and oriented to person, place, and time. He exhibits normal muscle tone.  Skin: Skin is warm. Capillary refill takes less than 2 seconds.  Psychiatric: He has  a normal mood and affect.  Nursing note and vitals reviewed.    ED Treatments / Results  Labs (all labs ordered are listed, but only abnormal results are displayed) Labs Reviewed - No data to display  EKG  EKG Interpretation None       Radiology Dg Chest 2 View  Result Date: 08/26/2016 CLINICAL DATA:  65 year old male with dislodged tube, now replaced. Initial encounter. EXAM: CHEST  2 VIEW COMPARISON:  07/30/2016 and earlier. FINDINGS: Tracheostomy tube is in place, and configuration appears appropriate. Partially visible tubing projects over the epigastrium. Stable large lung volumes. Mediastinal contours remain normal. No pneumothorax, pulmonary edema or pleural effusion. Opacity at the right lung base seen to correspond to severe bronchiectasis with architectural distortion in the right lower lobe on 07/17/2016 chest CT. No acute pulmonary opacity. Osteopenia.   No acute osseous abnormality identified. IMPRESSION: 1. Tracheostomy tube appears normally positioned. Epigastric tube which might be a feeding tube is only partially  visible. 2. No acute cardiopulmonary abnormality. Severe bronchiectasis in the right lower lobe with architectural distortion. Electronically Signed   By: Genevie Ann M.D.   On: 08/26/2016 15:49    Procedures Procedures (including critical care time)  Medications Ordered in ED Medications  hydrogen peroxide 3 % external solution (  Given by Other 08/26/16 1553)     Initial Impression / Assessment and Plan / ED Course  I have reviewed the triage vital signs and the nursing notes.  Pertinent labs & imaging results that were available during my care of the patient were reviewed by me and considered in my medical decision making (see chart for details).  Clinical Course    65 year old male with past medical history of throat cancer with chronic tracheostomy who presents with accidental dislodgment during coughing episode. Patient has history of recurrent dislodgment and noncompliance with home tracheostomy care. He is currently on hospice care and has refused any intervention on his tracheostomy in the past. Currently, he denies any complaints. The tracheostomy was replaced at bedside with very gentle pressure. Patient tolerated the procedure well. I was present and supervised the procedure. Post insertion x-ray shows appropriate placement. There was no bleeding or other signs of complication. Patient declines any further workup, and I believe this is reasonable as he was otherwise at his baseline state of health. Will discharge with outpatient follow-up.    Final Clinical Impressions(s) / ED Diagnoses   Final diagnoses:  Tracheostomy care [facility])    New Prescriptions Discharge Medication List as of 08/26/2016  4:02 PM       Duffy Bruce, MD 08/26/16 2130

## 2016-08-26 NOTE — Progress Notes (Signed)
Called to assess patient in the ED for trach displacement. Upon arrival, patients SPO2=98%, HR=88. Trach collar was to the side of the patients neck. Stoma site was cleaned well with Peroxide and water mix as well as the trach itself and inner cannula. There was no obturator present at the bedside. Lubricant was placed on the distal end of the trach itself and with a lil pressure was advanced into position. Inner cannula was then placed and ETCO2 detector represented positive color change. BBS were noted. Patient was never in apparent distress. SPO2 was 99%, RR 18, and HR 85.

## 2016-10-24 ENCOUNTER — Emergency Department (HOSPITAL_COMMUNITY)

## 2016-10-24 ENCOUNTER — Emergency Department (HOSPITAL_COMMUNITY)
Admission: EM | Admit: 2016-10-24 | Discharge: 2016-10-24 | Disposition: A | Discharge status: Home / Self Care | Attending: Emergency Medicine | Admitting: Emergency Medicine

## 2016-10-24 ENCOUNTER — Encounter (HOSPITAL_COMMUNITY): Payer: Self-pay | Admitting: Emergency Medicine

## 2016-10-24 DIAGNOSIS — I10 Essential (primary) hypertension: Secondary | ICD-10-CM | POA: Insufficient documentation

## 2016-10-24 DIAGNOSIS — K9423 Gastrostomy malfunction: Secondary | ICD-10-CM | POA: Insufficient documentation

## 2016-10-24 DIAGNOSIS — J449 Chronic obstructive pulmonary disease, unspecified: Secondary | ICD-10-CM | POA: Diagnosis not present

## 2016-10-24 DIAGNOSIS — F17210 Nicotine dependence, cigarettes, uncomplicated: Secondary | ICD-10-CM | POA: Diagnosis not present

## 2016-10-24 DIAGNOSIS — K9420 Gastrostomy complication, unspecified: Secondary | ICD-10-CM

## 2016-10-24 DIAGNOSIS — Z79899 Other long term (current) drug therapy: Secondary | ICD-10-CM | POA: Insufficient documentation

## 2016-10-24 MED ORDER — DIATRIZOATE MEGLUMINE & SODIUM 66-10 % PO SOLN
30.0000 mL | Freq: Once | ORAL | Status: DC
  Filled 2016-10-24: qty 30

## 2016-10-24 MED ORDER — DIATRIZOATE MEGLUMINE & SODIUM 66-10 % PO SOLN
30.0000 mL | Freq: Once | ORAL | Status: AC
  Administered 2016-10-24: 30 mL via JEJUNOSTOMY
  Filled 2016-10-24: qty 30

## 2016-10-24 MED ORDER — DIATRIZOATE MEGLUMINE & SODIUM 66-10 % PO SOLN
ORAL | Status: AC
  Administered 2016-10-24: 30 mL via JEJUNOSTOMY
  Filled 2016-10-24: qty 30

## 2016-10-24 NOTE — ED Provider Notes (Signed)
[facility] DEPT Provider Note   CSN: [id number] Arrival date & time: 10/24/16  1157     History   Chief Complaint Chief Complaint  Patient presents with  . Peg Tube displacement    HPI Randy Blackwell is a 65 y.o. male.  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 Possibly 4 months ago had G-tube placed for medication and nutrition. Also had a tracheostomy placed. Patient sent in by hospice nurse for concern for the G-tube not being in the right place. There has been no functional problems with the G-tube. Is not clogged. No concerns for infection around the G-tube. Patient denies any abdominal pain.      Past Medical History:  Diagnosis Date  . Alcohol abuse   . Bronchiectasis ([place])   . Cancer (HCC)    throat  . COPD (chronic obstructive pulmonary disease) ([place])   . GERD (gastroesophageal reflux disease)   . Hypertension    per PCP notes - pt states he's never been prescribed medication for this  . 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 Seizures ([place])    per PCP notes - pt states he's not had a seizure for over a year or more (as of 05/21/16.  [name] Shortness of breath dyspnea     Patient Active Problem List   Diagnosis Date Noted  . Alcohol use 08/03/2016  . Lethargy   . Altered mental status 08/02/2016  . 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 DNR (do not resuscitate) 07/30/2016  . Hospice care patient 07/30/2016  . Hemoptysis 07/17/2016  . Hypokalemia 07/17/2016  . Hyponatremia 07/17/2016  . Anemia 07/17/2016  . 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 Loss of weight 04/30/2016  . Dysphagia 04/30/2016    Past Surgical History:  Procedure Laterality Date  . COLONOSCOPY WITH PROPOFOL N/A 05/05/2016   Procedure: COLONOSCOPY WITH PROPOFOL;  Surgeon: Danie Binder, MD;  [facility];  Service: Endoscopy;  Laterality: N/A;  0730  . DIRECT LARYNGOSCOPY  05/22/2016  . DIRECT LARYNGOSCOPY N/A 05/22/2016   Procedure: DIRECT  LARYNGOSCOPY;  Surgeon: Melida Quitter, MD;  Location: [facility];  Service: ENT;  Laterality: N/A;  direct laryngoscopy with biopsy  . ESOPHAGOGASTRODUODENOSCOPY (EGD) WITH PROPOFOL N/A 05/05/2016   Procedure: ESOPHAGOGASTRODUODENOSCOPY (EGD) WITH PROPOFOL;  Surgeon: Danie Binder, MD;  [facility];  Service: Endoscopy;  Laterality: N/A;  . ESOPHAGOSCOPY N/A 05/22/2016   Procedure: ESOPHAGOSCOPY;  Surgeon: Melida Quitter, MD;  Location: [facility];  Service: ENT;  Laterality: N/A;  . None    . POLYPECTOMY  05/05/2016   Procedure: POLYPECTOMY;  Surgeon: Danie Binder, MD;  [facility];  Service: Endoscopy;;  cecal polyp cold bx,  . TRACHEOSTOMY         Home Medications    Prior to Admission medications   Medication Sig Start Date End Date Taking? Authorizing Provider  guaifenesin (ROBITUSSIN) 100 MG/5ML syrup Place 200-300 mg into feeding tube 3 (three) times daily as needed for cough.    Yes Historical Provider, MD  Nutritional Supplements (FEEDING SUPPLEMENT, JEVITY 1.5 CAL,) LIQD Place 1,000 mLs into feeding tube daily.   Yes Historical Provider, MD  traZODone (DESYREL) 50 MG tablet Place 50 mg into feeding tube at bedtime.    Yes Historical Provider, MD  acetaminophen (TYLENOL) 160 MG/5ML solution Take 480-960 mg by mouth every 6 (six) hours as needed for mild pain, moderate pain or fever.     Historical Provider, MD  acetaminophen (  TYLENOL) 650 MG suppository Place 650 mg rectally every 4 (four) hours as needed. For pain    Historical Provider, MD  LORazepam (ATIVAN) 2 MG/ML concentrated solution Take 0.5-1 mg by mouth every 4 (four) hours as needed for anxiety (shortness of breath).    Historical Provider, MD  sennosides (SENEXON) 8.8 MG/5ML syrup Take 10-15 mLs by mouth daily as needed for mild constipation. 10 ml daily and may take another 5 ml as needed with max dose of 15 mls daily.    Historical Provider, MD  sorbitol 70 % solution Take 30 mLs by mouth daily as needed  (constipation).    Historical Provider, MD    Family History Family History  Problem Relation Age of Onset  . Diabetes Mother   . Dementia Father   . Seizures Father   . Diabetes Father   . Hypertension Father   . Colon cancer Neg Hx   . Pancreatic cancer Neg Hx   . Stomach cancer Neg Hx     Social History Social History  Substance Use Topics  . Smoking status: Current Every Day Smoker    Packs/day: 1.00    Years: 50.00    Types: Cigarettes  . Smokeless tobacco: Never Used  . Alcohol use 7.2 oz/week    12 Standard drinks or equivalent per week     Comment: wine and beer. Drinks about a 6 pack of beer a day. About a 1/5 or more of wine a day.      Allergies   Review of patient's allergies indicates no known allergies.   Review of Systems Review of Systems  Constitutional: Negative for fever.  HENT: Negative for congestion.   Eyes: Negative for redness.  Respiratory: Negative for shortness of breath.   Cardiovascular: Negative for chest pain.  Gastrointestinal: Negative for abdominal pain.  Musculoskeletal: Negative for back pain.  Skin: Negative for rash.  Neurological: Negative for headaches.  Psychiatric/Behavioral: Negative for confusion.     Physical Exam Updated Vital Signs BP 99/69 (BP Location: Left Arm)   Pulse 101   Temp 98.5 F (36.9 C) (Oral)   Resp 20   Ht 5\' 11"  (1.803 m)   Wt 41.7 kg   SpO2 100%   BMI 12.83 kg/m   Physical Exam  Constitutional: He is oriented to person, place, and time. He appears well-developed. No distress.  HENT:  Head: Normocephalic.  Trach tube in place.  Eyes: EOM are normal. Pupils are equal, round, and reactive to light.  Cardiovascular: Normal rate and regular rhythm.   Pulmonary/Chest: Effort normal and breath sounds normal. No respiratory distress.  Abdominal: Soft. Bowel sounds are normal. He exhibits no distension. There is no tenderness.  G-tube in place left upper quadrant skin appears normal no  evidence of any secondary infection. G-tube is back and forth freely.  Musculoskeletal: Normal range of motion.  Neurological: He is oriented to person, place, and time.  Nursing note and vitals reviewed.    ED Treatments / Results  Labs (all labs ordered are listed, but only abnormal results are displayed) Labs Reviewed - No data to display  EKG  EKG Interpretation None       Radiology Dg Abdomen 1 View  Result Date: 10/24/2016 CLINICAL DATA:  Gastrostomy tube position check with Gastrografin administration EXAM: ABDOMEN - 1 VIEW COMPARISON:  None. FINDINGS: Percutaneous gastrostomy tube tip overlies the proximal body of the stomach. Administered contrast via the tube layers within the nondistended stomach and duodenum. Probable small  duodenal diverticulum arising from the third portion of the duodenum. No evidence of extraluminal contrast. No dilated small bowel loops. No evidence of pneumatosis or pneumoperitoneum. Minimal distal colonic stool. Clear lung bases. IMPRESSION: Well-positioned percutaneous gastrostomy tube over the proximal stomach, with no evidence of extraluminal contrast leak. Nonobstructive bowel gas pattern. Electronically Signed   By: Ilona Sorrel M.D.   On: 10/24/2016 13:26    Procedures Procedures (including critical care time)  Medications Ordered in ED Medications  diatrizoate meglumine-sodium (GASTROGRAFIN) 66-10 % solution 30 mL (not administered)  diatrizoate meglumine-sodium (GASTROGRAFIN) 66-10 % solution 30 mL (30 mLs Per J Tube Given 10/24/16 1307)     Initial Impression / Assessment and Plan / ED Course  I have reviewed the triage vital signs and the nursing notes.  Pertinent labs & imaging results that were available during my care of the patient were reviewed by me and considered in my medical decision making (see chart for details).  Clinical Course    KUB Gastrografin study used to confirm G-tube placement. Functioning fine [place] the  right location. No evidence of any proximal small bowel obstruction. Patient stable for discharge back home and to use the G-tube.  Final Clinical Impressions(s) / ED Diagnoses   Final diagnoses:  Gastrostomy complication [facility])    New Prescriptions New Prescriptions   No medications on file     Fredia Sorrow, MD 10/24/16 1400

## 2016-10-24 NOTE — ED Notes (Signed)
MD at bedside. 

## 2016-10-24 NOTE — ED Triage Notes (Signed)
PT is on care of hospice and has a peg tube d/t throat cancer and gives his medication and feeding boluses through the tube. PT states the tube partially came out a few days ago and he was unable to give himself the medications this am.

## 2016-10-24 NOTE — Discharge Instructions (Signed)
G-tube confirmed to be in the right place. Okay to use. Continue dressing changes. Continue your follow-up with the hospice nurse.

## 2016-10-25 ENCOUNTER — Encounter (HOSPITAL_COMMUNITY): Payer: Self-pay | Admitting: *Deleted

## 2016-10-25 ENCOUNTER — Emergency Department (HOSPITAL_COMMUNITY)
Admission: EM | Admit: 2016-10-25 | Discharge: 2016-10-25 | Disposition: A | Discharge status: Home / Self Care | Attending: Emergency Medicine | Admitting: Emergency Medicine

## 2016-10-25 ENCOUNTER — Emergency Department (HOSPITAL_COMMUNITY)

## 2016-10-25 DIAGNOSIS — J449 Chronic obstructive pulmonary disease, unspecified: Secondary | ICD-10-CM | POA: Diagnosis not present

## 2016-10-25 DIAGNOSIS — F17210 Nicotine dependence, cigarettes, uncomplicated: Secondary | ICD-10-CM | POA: Insufficient documentation

## 2016-10-25 DIAGNOSIS — Z79899 Other long term (current) drug therapy: Secondary | ICD-10-CM | POA: Diagnosis not present

## 2016-10-25 DIAGNOSIS — Z431 Encounter for attention to gastrostomy: Secondary | ICD-10-CM | POA: Diagnosis present

## 2016-10-25 DIAGNOSIS — I10 Essential (primary) hypertension: Secondary | ICD-10-CM | POA: Diagnosis not present

## 2016-10-25 DIAGNOSIS — K9429 Other complications of gastrostomy: Secondary | ICD-10-CM | POA: Insufficient documentation

## 2016-10-25 DIAGNOSIS — T85528A Displacement of other gastrointestinal prosthetic devices, implants and grafts, initial encounter: Secondary | ICD-10-CM

## 2016-10-25 MED ORDER — DIATRIZOATE MEGLUMINE & SODIUM 66-10 % PO SOLN
30.0000 mL | Freq: Once | ORAL | Status: AC
  Administered 2016-10-25: 30 mL via JEJUNOSTOMY
  Filled 2016-10-25: qty 30

## 2016-10-25 MED ORDER — DIATRIZOATE MEGLUMINE & SODIUM 66-10 % PO SOLN
ORAL | Status: AC
  Administered 2016-10-25: 30 mL via JEJUNOSTOMY
  Filled 2016-10-25: qty 30

## 2016-10-25 NOTE — ED Triage Notes (Signed)
Pt has feeding tube dislodged this am.

## 2016-10-25 NOTE — Discharge Instructions (Signed)
Gastrostomy tube replaced confirmed to be in the stomach by x-ray with Gastrografin. Can be used. Return as needed.

## 2016-10-25 NOTE — ED Provider Notes (Signed)
[facility] DEPT Provider Note   CSN: [id number] Arrival date & time: 10/25/16  1205     History   Chief Complaint Chief Complaint  Patient presents with  . feeding tube replaced    HPI Randy Blackwell is a 65 y.o. male.  Patient returns today with a gastrostomy tube pulled out. Saw him yesterday confirm placement and that was functional. Patient states that he got pulled out this morning. Patient takes nothing by mouth. Patient has a history of head neck cancer status post trach E ostomy and G-tube placement approximate 4 months ago. Patient is under hospice care.      Past Medical History:  Diagnosis Date  . Alcohol abuse   . Bronchiectasis ([place])   . Cancer (HCC)    throat  . COPD (chronic obstructive pulmonary disease) ([place])   . GERD (gastroesophageal reflux disease)   . Hypertension    per PCP notes - pt states he's never been prescribed medication for this  . 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 Seizures ([place])    per PCP notes - pt states he's not had a seizure for over a year or more (as of 05/21/16.  [name] Shortness of breath dyspnea     Patient Active Problem List   Diagnosis Date Noted  . Alcohol use 08/03/2016  . Lethargy   . Altered mental status 08/02/2016  . 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 DNR (do not resuscitate) 07/30/2016  . Hospice care patient 07/30/2016  . Hemoptysis 07/17/2016  . Hypokalemia 07/17/2016  . Hyponatremia 07/17/2016  . Anemia 07/17/2016  . 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 Loss of weight 04/30/2016  . Dysphagia 04/30/2016    Past Surgical History:  Procedure Laterality Date  . COLONOSCOPY WITH PROPOFOL N/A 05/05/2016   Procedure: COLONOSCOPY WITH PROPOFOL;  Surgeon: Danie Binder, MD;  [facility];  Service: Endoscopy;  Laterality: N/A;  0730  . DIRECT LARYNGOSCOPY  05/22/2016  . DIRECT LARYNGOSCOPY N/A 05/22/2016   Procedure: DIRECT LARYNGOSCOPY;  Surgeon: Melida Quitter, MD;  Location: [facility];  Service: ENT;  Laterality: N/A;  direct laryngoscopy with biopsy  . ESOPHAGOGASTRODUODENOSCOPY (EGD) WITH PROPOFOL N/A 05/05/2016   Procedure: ESOPHAGOGASTRODUODENOSCOPY (EGD) WITH PROPOFOL;  Surgeon: Danie Binder, MD;  [facility];  Service: Endoscopy;  Laterality: N/A;  . ESOPHAGOSCOPY N/A 05/22/2016   Procedure: ESOPHAGOSCOPY;  Surgeon: Melida Quitter, MD;  Location: [facility];  Service: ENT;  Laterality: N/A;  . None    . POLYPECTOMY  05/05/2016   Procedure: POLYPECTOMY;  Surgeon: Danie Binder, MD;  [facility];  Service: Endoscopy;;  cecal polyp cold bx,  . TRACHEOSTOMY         Home Medications    Prior to Admission medications   Medication Sig Start Date End Date Taking? Authorizing Provider  guaifenesin (ROBITUSSIN) 100 MG/5ML syrup Place 200-300 mg into feeding tube 3 (three) times daily as needed for cough.    Yes Historical Provider, MD  Nutritional Supplements (FEEDING SUPPLEMENT, JEVITY 1.5 CAL,) LIQD Place 1,000 mLs into feeding tube daily.   Yes Historical Provider, MD  traZODone (DESYREL) 50 MG tablet Place 50 mg into feeding tube at bedtime.    Yes Historical Provider, MD  acetaminophen (TYLENOL) 160 MG/5ML solution Take 480-960 mg by mouth every 6 (six) hours as needed for mild pain, moderate pain or fever.     Historical Provider, MD  acetaminophen (TYLENOL) 650 MG suppository Place 650 mg rectally every  4 (four) hours as needed. For pain    Historical Provider, MD  LORazepam (ATIVAN) 2 MG/ML concentrated solution Take 0.5-1 mg by mouth every 4 (four) hours as needed for anxiety (shortness of breath).    Historical Provider, MD  sennosides (SENEXON) 8.8 MG/5ML syrup Take 10-15 mLs by mouth daily as needed for mild constipation. 10 ml daily and may take another 5 ml as needed with max dose of 15 mls daily.    Historical Provider, MD  sorbitol 70 % solution Take 30 mLs by mouth daily as needed (constipation).    Historical Provider, MD    Family  History Family History  Problem Relation Age of Onset  . Diabetes Mother   . Dementia Father   . Seizures Father   . Diabetes Father   . Hypertension Father   . Colon cancer Neg Hx   . Pancreatic cancer Neg Hx   . Stomach cancer Neg Hx     Social History Social History  Substance Use Topics  . Smoking status: Current Every Day Smoker    Packs/day: 1.00    Years: 50.00    Types: Cigarettes  . Smokeless tobacco: Never Used  . Alcohol use 7.2 oz/week    12 Standard drinks or equivalent per week     Comment: wine and beer. Drinks about a 6 pack of beer a day. About a 1/5 or more of wine a day.      Allergies   Review of patient's allergies indicates no known allergies.   Review of Systems Review of Systems  Constitutional: Negative for fever.  HENT: Negative for congestion.   Eyes: Negative for visual disturbance.  Respiratory: Negative for shortness of breath.   Cardiovascular: Negative for chest pain.  Gastrointestinal: Negative for abdominal pain, nausea and vomiting.  Musculoskeletal: Negative for back pain.  Neurological: Negative for headaches.  Hematological: Does not bruise/bleed easily.  Psychiatric/Behavioral: Negative for confusion.     Physical Exam Updated Vital Signs BP 138/82 (BP Location: Left Arm)   Pulse (!) 51   Temp 98.1 F (36.7 C) (Oral)   Resp 17   Ht 5\' 11"  (1.803 m)   Wt 41.7 kg   SpO2 96%   BMI 12.83 kg/m   Physical Exam  Constitutional: He is oriented to person, place, and time. He appears well-developed.  Patient very thin  Eyes: Conjunctivae and EOM are normal. Pupils are equal, round, and reactive to light.  Neck:  Tracheostomy in place  Cardiovascular: Normal rate, regular rhythm and normal heart sounds.   Pulmonary/Chest: Effort normal and breath sounds normal. No respiratory distress.  Abdominal: Soft. He exhibits no distension. There is no tenderness.  G-tube site left upper quadrant. No significant bleeding. No signs  of infection.  Musculoskeletal: Normal range of motion. He exhibits no edema or deformity.  Neurological: He is alert and oriented to person, place, and time. No cranial nerve deficit. He exhibits normal muscle tone. Coordination normal.  Skin: Skin is warm.     ED Treatments / Results  Labs (all labs ordered are listed, but only abnormal results are displayed) Labs Reviewed - No data to display  EKG  EKG Interpretation None       Radiology Dg Abdomen 1 View  Result Date: 10/25/2016 CLINICAL DATA:  G-tube placement. EXAM: ABDOMEN - 1 VIEW COMPARISON:  October 24, 2016 FINDINGS: The patient's G tube remains in place. After injection, there is filling of the stomach an the proximal duodenum. Contrast is also  seen throughout the colon from yesterday's study. No convincing evidence of leak. IMPRESSION: Filling of the stomach with G-tube injection. No convincing evidence of leak. Electronically Signed   By: Dorise Bullion III M.D   On: 10/25/2016 14:50   Dg Abdomen 1 View  Result Date: 10/24/2016 CLINICAL DATA:  Gastrostomy tube position check with Gastrografin administration EXAM: ABDOMEN - 1 VIEW COMPARISON:  None. FINDINGS: Percutaneous gastrostomy tube tip overlies the proximal body of the stomach. Administered contrast via the tube layers within the nondistended stomach and duodenum. Probable small duodenal diverticulum arising from the third portion of the duodenum. No evidence of extraluminal contrast. No dilated small bowel loops. No evidence of pneumatosis or pneumoperitoneum. Minimal distal colonic stool. Clear lung bases. IMPRESSION: Well-positioned percutaneous gastrostomy tube over the proximal stomach, with no evidence of extraluminal contrast leak. Nonobstructive bowel gas pattern. Electronically Signed   By: Ilona Sorrel M.D.   On: 10/24/2016 13:26    Procedures Procedures (including critical care time)   Gastrostomy tube replacement Performed by:  Ferris Tally Consent: Verbal consent obtained. Risks and benefits: risks, benefits and alternatives were discussed Required items: required blood products, implants, devices, and special equipment available Patient identity confirmed: hospital-assigned identification number Time out: Immediately prior to procedure a "time out" was called to verify the correct patient, procedure, equipment, support staff and site/side marked as required. Preparation: Patient was prepped and draped in the usual sterile fashion. Patient tolerance: Patient tolerated the procedure well with no immediate complications.  Comments: 14 french Gastrostomy tube placed without difficulty. Site were previous G-tube was was slightly edematous. Did not use a wire instead started with a 10 French catheter expanded the opening up to 16 [place]. And then placed the the permanent G-tube with the balloon up. Did Gastrografin study which confirmed placement.  Medications Ordered in ED Medications  diatrizoate meglumine-sodium (GASTROGRAFIN) 66-10 % solution 30 mL (30 mLs Per J Tube Given 10/25/16 1418)     Initial Impression / Assessment and Plan / ED Course  I have reviewed the triage vital signs and the nursing notes.  Pertinent labs & imaging results that were available during my care of the patient were reviewed by me and considered in my medical decision making (see chart for details).  Clinical Course    New G-tube functioning properly patient can be discharged home.  Final Clinical Impressions(s) / ED Diagnoses   Final diagnoses:  Dislodged gastrostomy tube [facility])    New Prescriptions New Prescriptions   No medications on file     Fredia Sorrow, MD 10/25/16 1520

## 2016-11-27 DEATH — deceased

## 2018-02-05 IMAGING — DX DG CHEST 2V
2 series · 2 of 2 positions shown · non-contrast
Comparison: Chest CT, 04/10/2016

CLINICAL DATA: Pt's brother states they were seen at [[facility]ACTED]
last night and had tests ran for several months of coughing up blood
and throat pain. Said someone called this morning and said to go to
the emergency room but does not know why.

EXAM:
CHEST  2 VIEW

[chest pa]
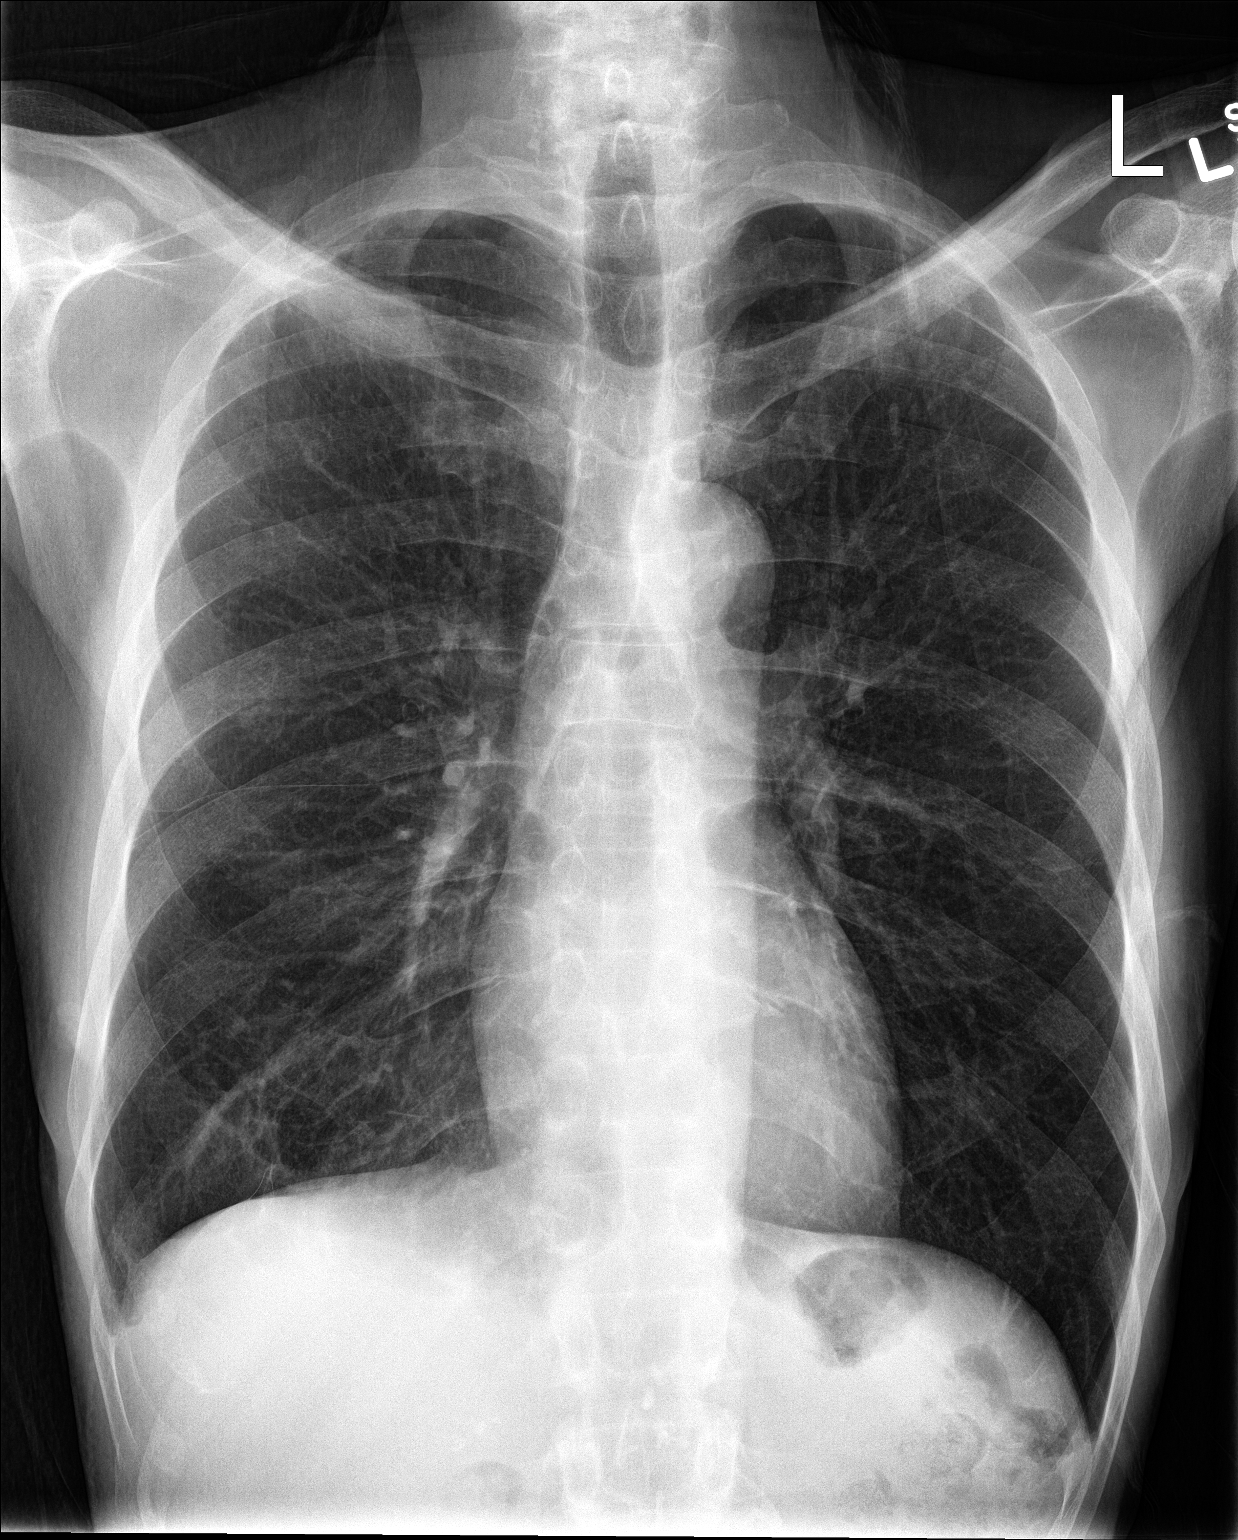

[chest lat]
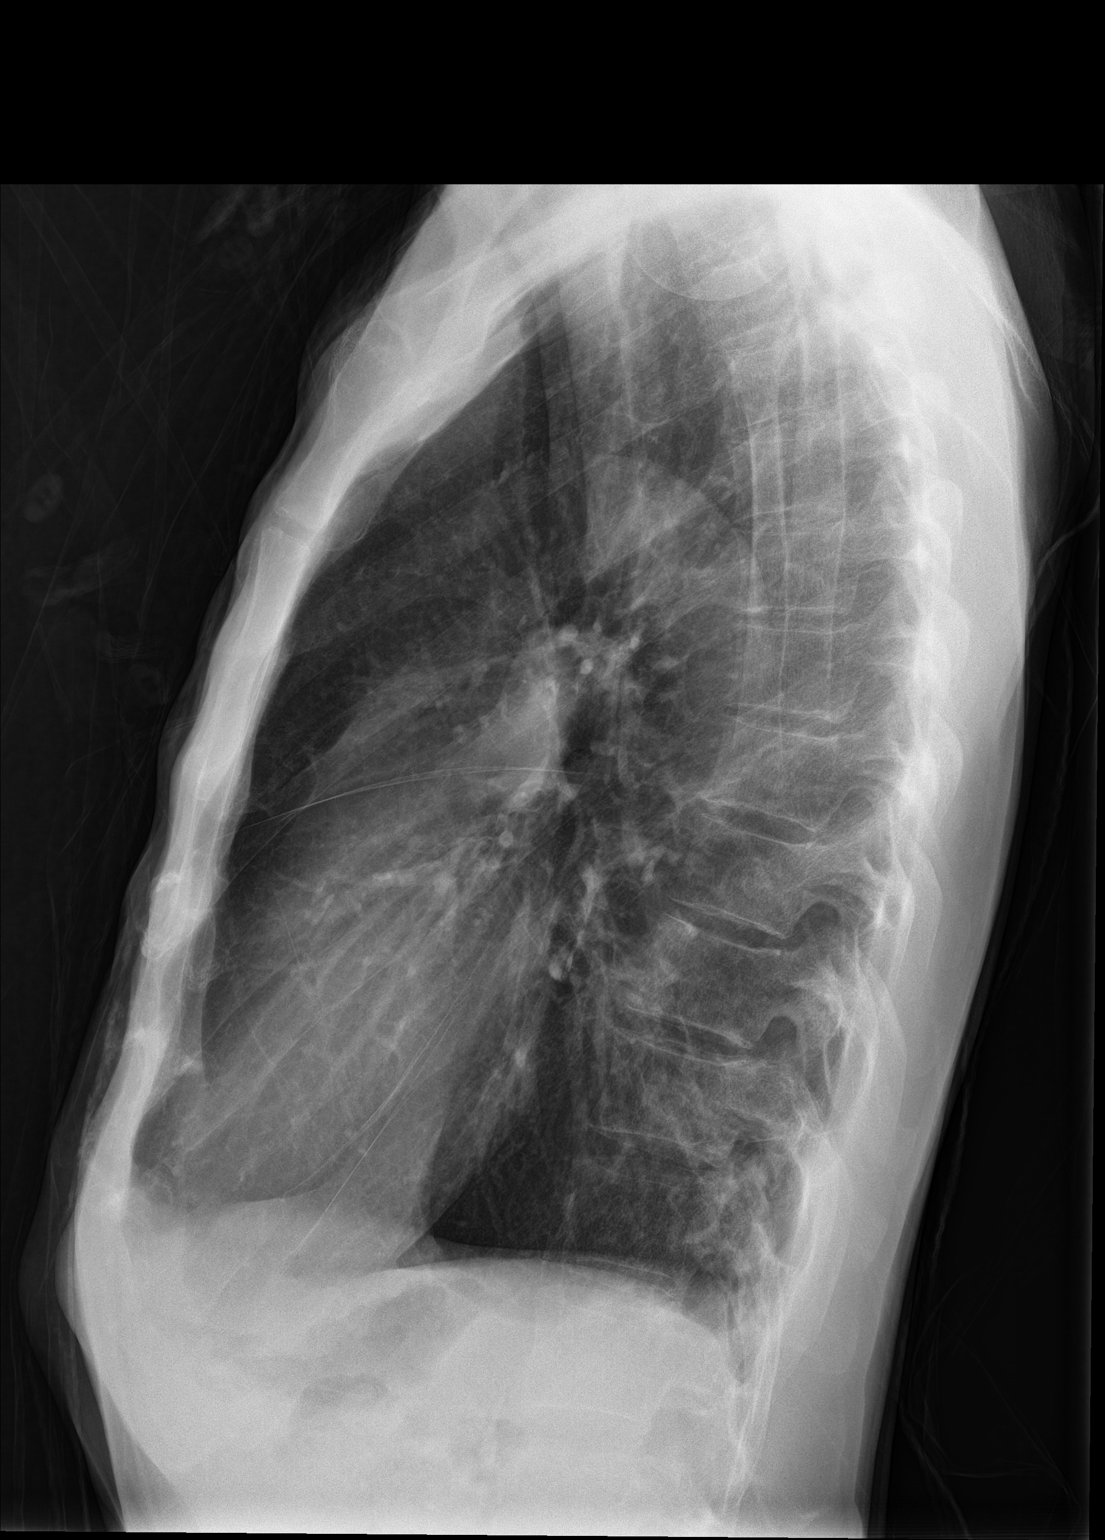

[2 of 2 positions shown; findings below may reference images not displayed]

FINDINGS: Normal heart, mediastinum and hila.

Lungs show mild increased markings a right base reflecting the
cystic bronchiectasis noted on prior CT. No evidence of pneumonia or
pulmonary edema. No pleural effusion or pneumothorax.

Skeletal structures are unremarkable.
IMPRESSION: No acute cardiopulmonary disease.

## 2018-05-01 IMAGING — CT CT CHEST W/ CM
3 of 8 series · 15 of 36 positions shown, 17 images · IV contrast (iopamidol)
Comparison: CT of the chest April 10, 2016 and CT of the neck [place]

CLINICAL DATA: Bleeding from tracheostomy site. Known pharyngeal
mass.

EXAM:
CT CHEST WITH CONTRAST
TECHNIQUE: Multidetector CT imaging of the chest was performed during
intravenous contrast administration.
CONTRAST:  100mL DFMU1P-TZZ IOPAMIDOL (DFMU1P-TZZ) INJECTION 61%

[Series 3: routine chest w without · axial · non-contrast · 0.57mm/px · z∈[-387,-123]mm · 7 of 176 slices shown, 9 images]
[im 22/176  mediastinal]
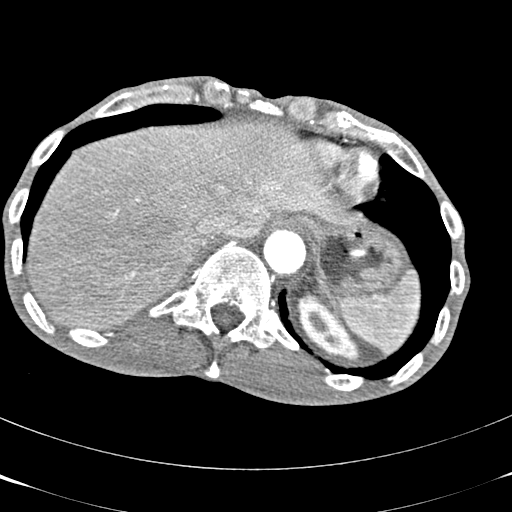
[im 22/176  lung]
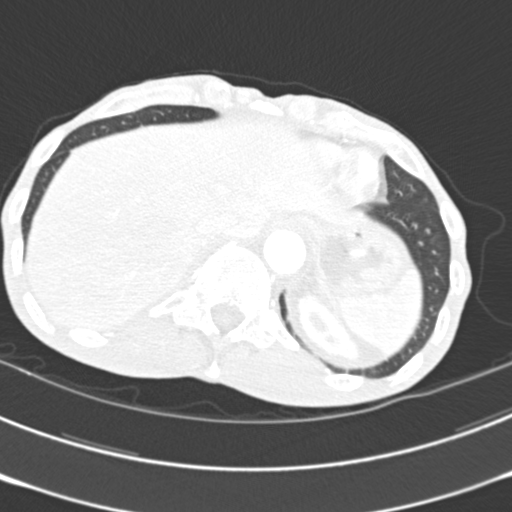
[im 44/176  lung]
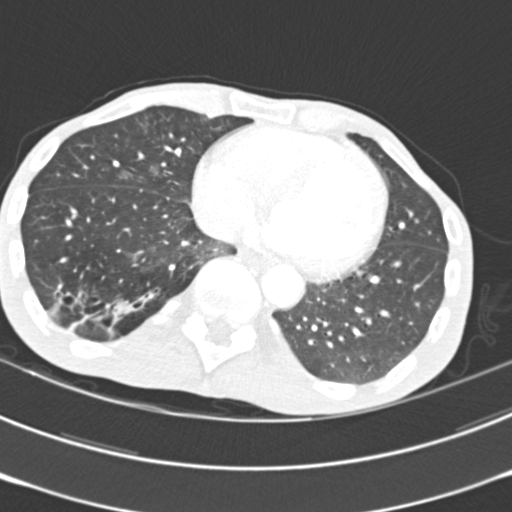
[im 66/176  lung]
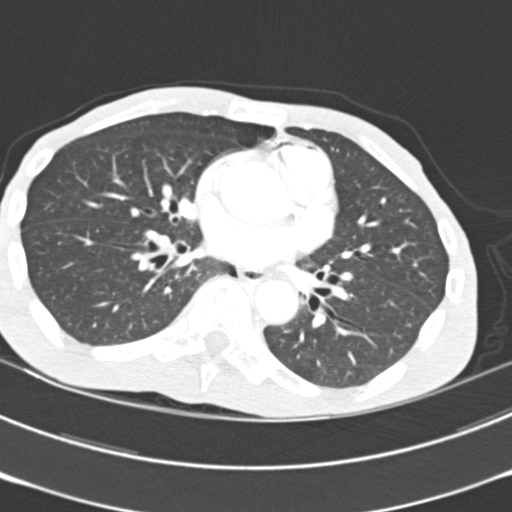
[im 88/176  lung]
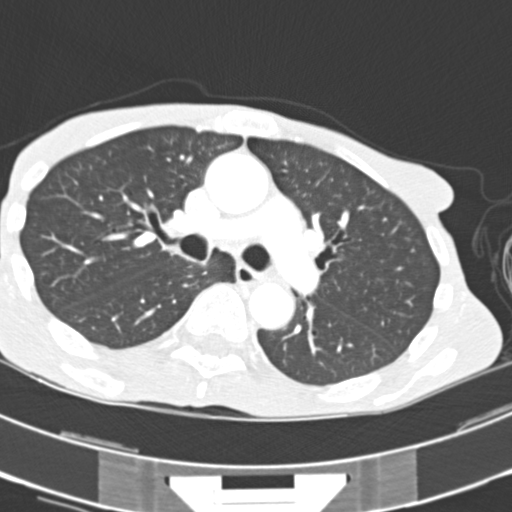
[im 110/176  mediastinal]
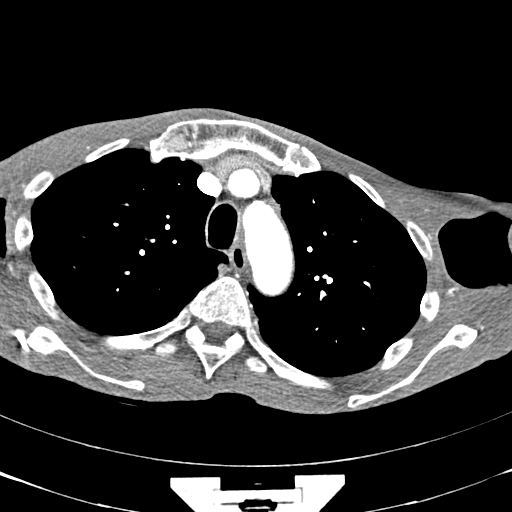
[im 110/176  lung]
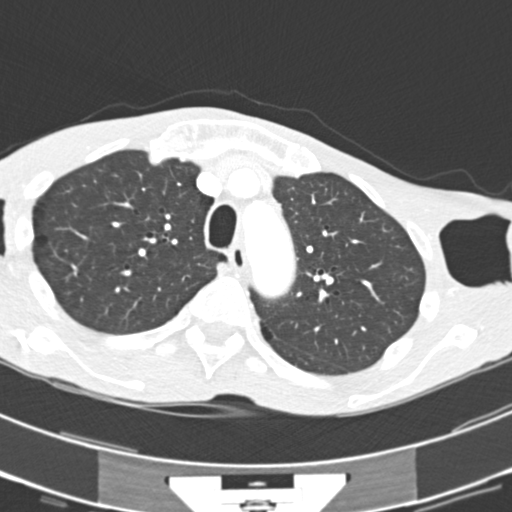
[im 132/176  lung]
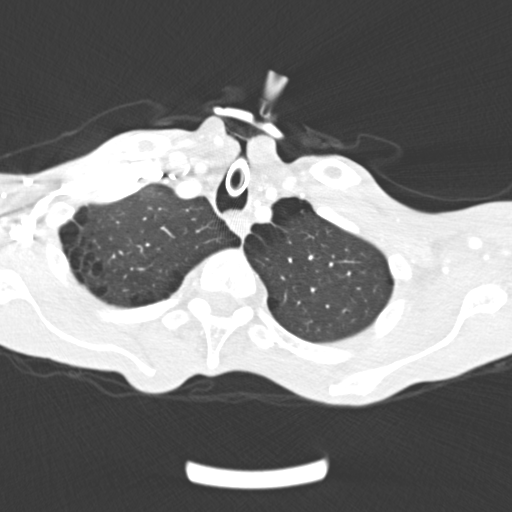
[im 154/176  lung]
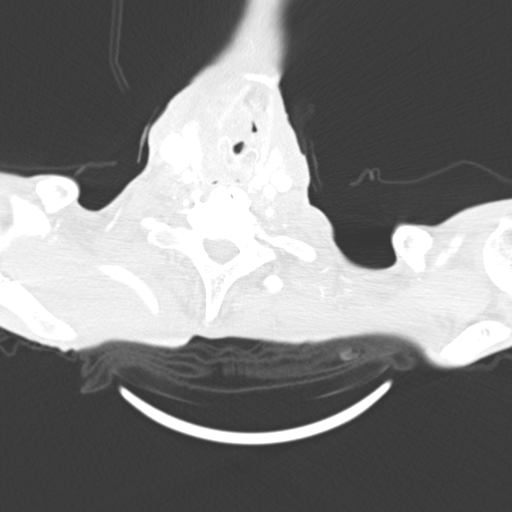

[Series 4: lungs · axial · 0.56mm/px · z∈[-387,-123]mm · 7 of 176 slices shown]
[im 22/176  lung]
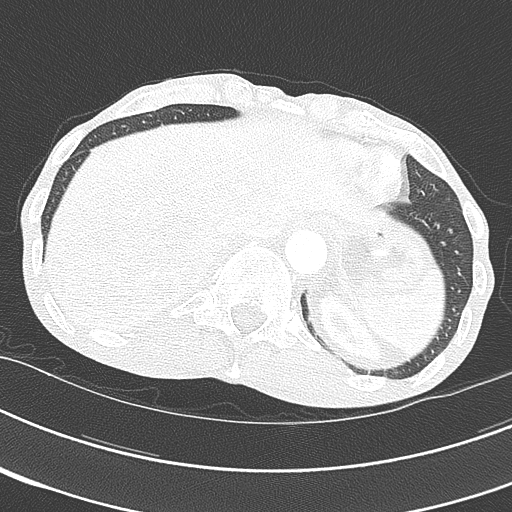
[im 44/176  lung]
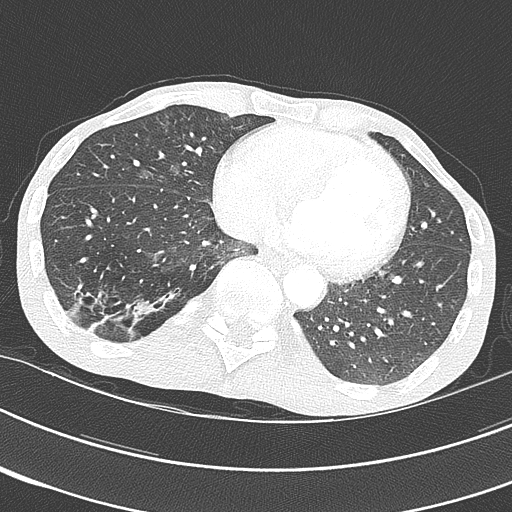
[im 66/176  lung]
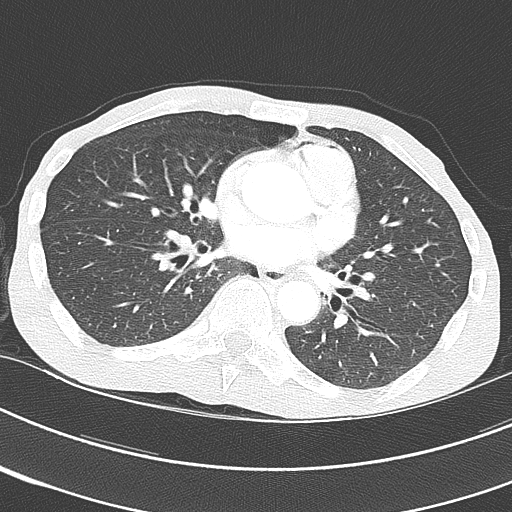
[im 88/176  lung]
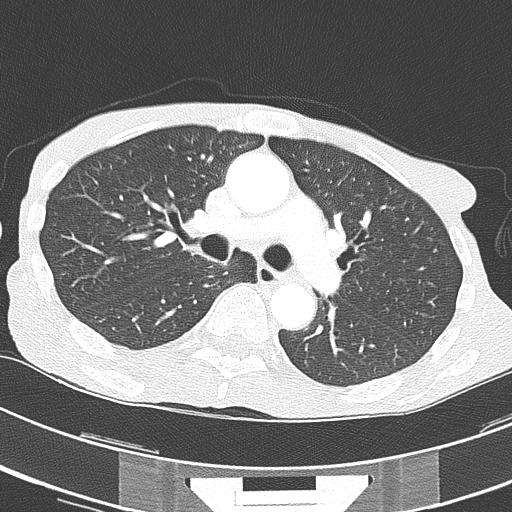
[im 110/176  lung]
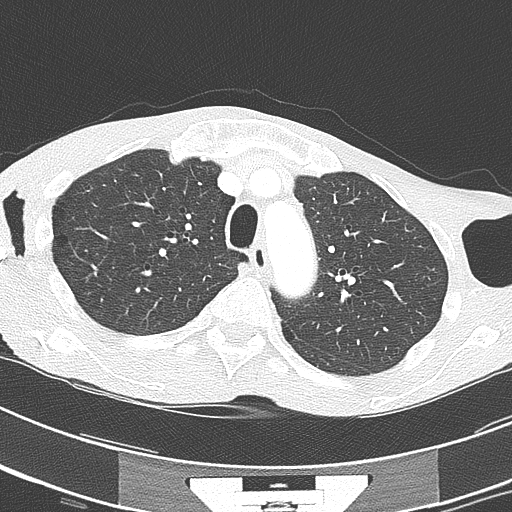
[im 132/176  lung]
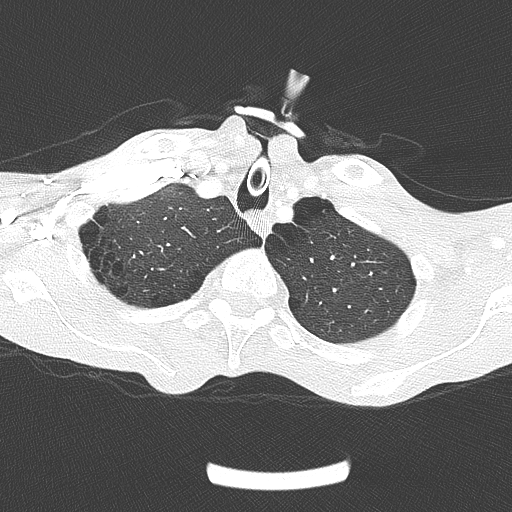
[im 154/176  lung]
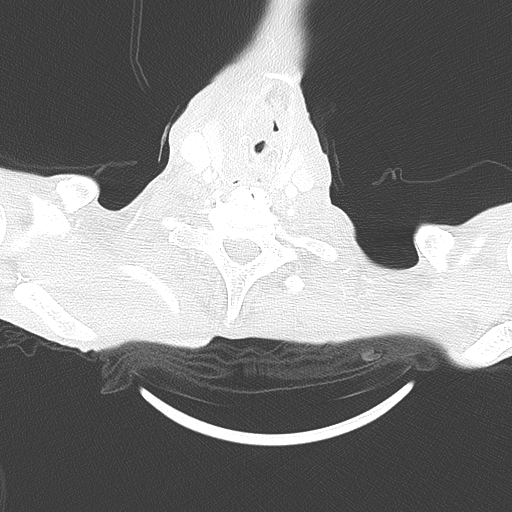

[Series 5: coronal · coronal · 0.60mm/px · 1 of 105 slices shown]
[im 53/105  lung]
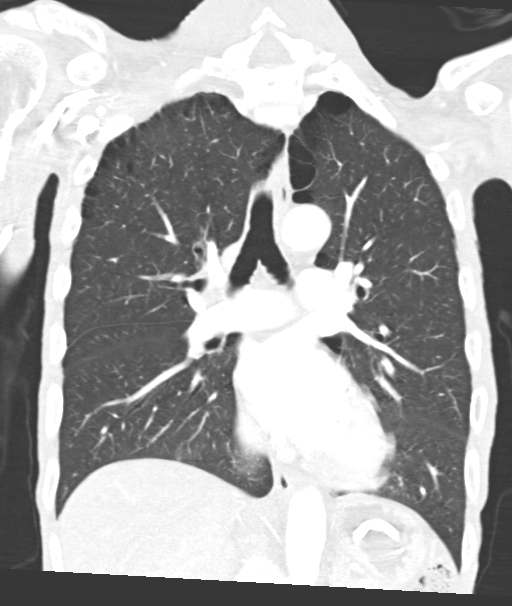

[15 of 36 positions shown; findings below may reference images not displayed]

FINDINGS: The patient's tracheostomy tube is in good position. A small amount
of fluid is seen posteriorly in the trachea, probably mucus. The
central airways are otherwise normal. Mild scarring is seen in the
lung apices. Emphysematous changes are seen in the lungs.
Bronchiectasis remains in the right lower lobe, not significantly
changed. Just distal and posterior to the bronchiectasis is more
focal opacity, unchanged, likely scarring. No suspicious pulmonary
nodules or masses. Mild ground-glass in the right middle lobe and
central right lower lobe is likely infectious or inflammatory
process. No other focal infiltrate.

The patient has known large pharyngeal mass is again identified
extending from the hypopharynx to below the level the cricoid
posteriorly. The mass appears to extend more inferiorly within the
larynx. A full description of the known mass will be made on the CT
of the neck. A few prominent nodes are seen in the right neck. No
effusions. No adenopathy is seen within the chest. The thoracic
aorta is non aneurysmal with scattered atherosclerotic change. The
heart is unchanged. There are coronary artery calcifications.
Evaluation of the central pulmonary arteries is unremarkable.

Evaluation of the upper abdomen is limited but unchanged and
unremarkable.

No acute bony abnormalities.
IMPRESSION: 1. The patient's known pharyngeal mass, extending into the larynx,
may have progressed in the interval. However, the mass will be more
fully described on the CT of the neck.
2. Bronchiectasis in the right lower lobe with adjacent soft tissue
opacity, likely scarring, is unchanged.
3. No other acute abnormalities in the chest.
